# Patient Record
Sex: Female | Born: 1970 | Race: Black or African American | Hispanic: No | Marital: Married | Smoking: Former smoker
Health system: Southern US, Community
[De-identification: ages and names within clinical notes are randomized; demographics above are authoritative.]

## PROBLEM LIST (undated history)

## (undated) DIAGNOSIS — F329 Major depressive disorder, single episode, unspecified: Secondary | ICD-10-CM

## (undated) DIAGNOSIS — F32A Depression, unspecified: Secondary | ICD-10-CM

## (undated) HISTORY — PX: TUBAL LIGATION: SHX77

---

## 2004-10-25 HISTORY — PX: ENDOMETRIAL ABLATION: SHX621

## 2005-09-07 ENCOUNTER — Ambulatory Visit: Payer: Self-pay | Admitting: Internal Medicine

## 2005-11-09 ENCOUNTER — Ambulatory Visit: Payer: Self-pay | Admitting: Obstetrics and Gynecology

## 2005-11-11 ENCOUNTER — Ambulatory Visit: Payer: Self-pay | Admitting: Obstetrics and Gynecology

## 2007-11-28 ENCOUNTER — Ambulatory Visit: Payer: Self-pay | Admitting: Internal Medicine

## 2009-03-19 ENCOUNTER — Ambulatory Visit: Payer: Self-pay | Admitting: Internal Medicine

## 2009-03-28 ENCOUNTER — Ambulatory Visit: Payer: Self-pay | Admitting: Internal Medicine

## 2009-07-18 ENCOUNTER — Other Ambulatory Visit: Payer: Self-pay | Admitting: Internal Medicine

## 2010-06-19 ENCOUNTER — Emergency Department: Payer: Self-pay | Admitting: Emergency Medicine

## 2010-08-12 ENCOUNTER — Ambulatory Visit: Payer: Self-pay | Admitting: Internal Medicine

## 2010-10-16 ENCOUNTER — Ambulatory Visit: Payer: Self-pay | Admitting: General Practice

## 2010-10-25 ENCOUNTER — Ambulatory Visit: Payer: Self-pay | Admitting: General Practice

## 2011-08-02 ENCOUNTER — Other Ambulatory Visit: Payer: Self-pay | Admitting: Internal Medicine

## 2012-01-12 ENCOUNTER — Ambulatory Visit: Payer: Self-pay | Admitting: Internal Medicine

## 2012-04-07 ENCOUNTER — Other Ambulatory Visit: Payer: Self-pay | Admitting: Internal Medicine

## 2012-04-07 LAB — LIPID PANEL
Cholesterol: 107 mg/dL (ref 0–200)
HDL Cholesterol: 26 mg/dL — ABNORMAL LOW (ref 40–60)
Ldl Cholesterol, Calc: 54 mg/dL (ref 0–100)
Triglycerides: 135 mg/dL (ref 0–200)
VLDL Cholesterol, Calc: 27 mg/dL (ref 5–40)

## 2012-04-07 LAB — HEPATIC FUNCTION PANEL A (ARMC)
SGOT(AST): 22 U/L (ref 15–37)
SGPT (ALT): 24 U/L
Total Protein: 7.3 g/dL (ref 6.4–8.2)

## 2012-04-07 LAB — HEMOGLOBIN A1C: Hemoglobin A1C: 7.1 % — ABNORMAL HIGH (ref 4.2–6.3)

## 2012-05-04 ENCOUNTER — Ambulatory Visit: Payer: Self-pay | Admitting: Specialist

## 2012-05-12 ENCOUNTER — Ambulatory Visit: Payer: Self-pay | Admitting: Specialist

## 2012-05-12 LAB — IRON AND TIBC
Iron Bind.Cap.(Total): 384 ug/dL (ref 250–450)
Iron Saturation: 22 %
Iron: 85 ug/dL (ref 50–170)
Unbound Iron-Bind.Cap.: 299 ug/dL

## 2012-05-12 LAB — COMPREHENSIVE METABOLIC PANEL
Alkaline Phosphatase: 69 U/L (ref 50–136)
Bilirubin,Total: 0.3 mg/dL (ref 0.2–1.0)
Calcium, Total: 9.1 mg/dL (ref 8.5–10.1)
Chloride: 101 mmol/L (ref 98–107)
Co2: 31 mmol/L (ref 21–32)
Creatinine: 0.83 mg/dL (ref 0.60–1.30)
EGFR (African American): >60
EGFR (Non-African Amer.): >60
Potassium: 3.7 mmol/L (ref 3.5–5.1)
SGOT(AST): 31 U/L (ref 15–37)
SGPT (ALT): 30 U/L

## 2012-05-12 LAB — CBC WITH DIFFERENTIAL/PLATELET
Basophil %: 1 %
Eosinophil %: 2.9 %
HGB: 12.8 g/dL (ref 12.0–16.0)
Lymphocyte %: 33.9 %
Neutrophil %: 55.7 %
RBC: 4.6 10*6/uL (ref 3.80–5.20)
WBC: 9.7 10*3/uL (ref 3.6–11.0)

## 2012-05-12 LAB — FERRITIN: Ferritin (ARMC): 217 ng/mL (ref 8–388)

## 2012-05-12 LAB — PROTIME-INR
INR: 0.9
Prothrombin Time: 12.5 secs (ref 11.5–14.7)

## 2012-05-12 LAB — AMYLASE: Amylase: 68 U/L (ref 25–115)

## 2012-05-12 LAB — TSH: Thyroid Stimulating Horm: 2.8 u[IU]/mL

## 2012-05-12 LAB — BILIRUBIN, DIRECT: Bilirubin, Direct: <0.1 mg/dL (ref 0.00–0.20)

## 2012-05-12 LAB — LIPASE, BLOOD: Lipase: 98 U/L (ref 73–393)

## 2012-05-12 LAB — MAGNESIUM: Magnesium: 1.9 mg/dL

## 2012-05-25 ENCOUNTER — Ambulatory Visit: Payer: Self-pay | Admitting: Specialist

## 2012-06-16 ENCOUNTER — Ambulatory Visit: Payer: Self-pay | Admitting: Specialist

## 2012-06-25 ENCOUNTER — Ambulatory Visit: Payer: Self-pay | Admitting: Specialist

## 2012-07-25 ENCOUNTER — Ambulatory Visit: Payer: Self-pay | Admitting: Specialist

## 2012-07-27 ENCOUNTER — Ambulatory Visit: Payer: Self-pay | Admitting: Gastroenterology

## 2012-08-25 HISTORY — PX: LAPAROSCOPIC GASTRIC SLEEVE RESECTION: SHX5895

## 2012-09-06 ENCOUNTER — Ambulatory Visit: Payer: Self-pay | Admitting: Specialist

## 2012-09-12 ENCOUNTER — Inpatient Hospital Stay: Payer: Self-pay | Admitting: Specialist

## 2012-09-13 LAB — CBC WITH DIFFERENTIAL/PLATELET
Basophil #: 0 10*3/uL (ref 0.0–0.1)
Eosinophil #: 0 10*3/uL (ref 0.0–0.7)
Lymphocyte #: 1.1 10*3/uL (ref 1.0–3.6)
Lymphocyte %: 8 %
MCH: 28.4 pg (ref 26.0–34.0)
MCHC: 33.9 g/dL (ref 32.0–36.0)
MCV: 84 fL (ref 80–100)
Monocyte %: 4.3 %
Neutrophil #: 12.3 10*3/uL — ABNORMAL HIGH (ref 1.4–6.5)
Neutrophil %: 87.6 %
Platelet: 197 10*3/uL (ref 150–440)
RDW: 15.3 % — ABNORMAL HIGH (ref 11.5–14.5)

## 2012-09-13 LAB — BASIC METABOLIC PANEL
Anion Gap: 8 (ref 7–16)
BUN: 10 mg/dL (ref 7–18)
Calcium, Total: 8.5 mg/dL (ref 8.5–10.1)
Co2: 25 mmol/L (ref 21–32)
Glucose: 122 mg/dL — ABNORMAL HIGH (ref 65–99)
Osmolality: 276 (ref 275–301)
Potassium: 3.6 mmol/L (ref 3.5–5.1)
Sodium: 138 mmol/L (ref 136–145)

## 2012-09-13 LAB — MAGNESIUM: Magnesium: 1.7 mg/dL — ABNORMAL LOW

## 2012-10-02 ENCOUNTER — Ambulatory Visit: Payer: Self-pay | Admitting: Specialist

## 2013-02-02 ENCOUNTER — Other Ambulatory Visit: Payer: Self-pay | Admitting: Specialist

## 2013-02-02 LAB — CBC WITH DIFFERENTIAL/PLATELET
Eosinophil %: 1 %
HCT: 37.6 % (ref 35.0–47.0)
HGB: 12.5 g/dL (ref 12.0–16.0)
MCH: 28.9 pg (ref 26.0–34.0)
MCHC: 33.2 g/dL (ref 32.0–36.0)
Neutrophil #: 5.1 10*3/uL (ref 1.4–6.5)
Platelet: 203 10*3/uL (ref 150–440)
RDW: 15.2 % — ABNORMAL HIGH (ref 11.5–14.5)

## 2013-02-02 LAB — PHOSPHORUS: Phosphorus: 3.7 mg/dL (ref 2.5–4.9)

## 2013-02-02 LAB — COMPREHENSIVE METABOLIC PANEL
Albumin: 3.7 g/dL (ref 3.4–5.0)
Alkaline Phosphatase: 72 U/L (ref 50–136)
Bilirubin,Total: 0.4 mg/dL (ref 0.2–1.0)
Chloride: 100 mmol/L (ref 98–107)
EGFR (African American): >60
Glucose: 91 mg/dL (ref 65–99)
Osmolality: 271 (ref 275–301)
Potassium: 3.1 mmol/L — ABNORMAL LOW (ref 3.5–5.1)
SGOT(AST): 15 U/L (ref 15–37)
SGPT (ALT): 16 U/L (ref 12–78)
Total Protein: 7.6 g/dL (ref 6.4–8.2)

## 2013-02-02 LAB — AMYLASE: Amylase: 59 U/L (ref 25–115)

## 2013-02-02 LAB — FOLATE: Folic Acid: 19 ng/mL (ref 3.1–100.0)

## 2013-02-02 LAB — IRON: Iron: 35 ug/dL — ABNORMAL LOW (ref 50–170)

## 2013-02-02 LAB — MAGNESIUM: Magnesium: 1.8 mg/dL

## 2013-02-02 LAB — FERRITIN: Ferritin (ARMC): 242 ng/mL (ref 8–388)

## 2013-06-06 ENCOUNTER — Ambulatory Visit (INDEPENDENT_AMBULATORY_CARE_PROVIDER_SITE_OTHER): Payer: 59 | Admitting: Family Medicine

## 2013-06-06 ENCOUNTER — Encounter: Payer: Self-pay | Admitting: Family Medicine

## 2013-06-06 VITALS — BP 120/82 | HR 68 | Temp 98.3°F | Ht 67.75 in | Wt 184.0 lb

## 2013-06-06 DIAGNOSIS — J309 Allergic rhinitis, unspecified: Secondary | ICD-10-CM

## 2013-06-06 DIAGNOSIS — Z8679 Personal history of other diseases of the circulatory system: Secondary | ICD-10-CM | POA: Insufficient documentation

## 2013-06-06 DIAGNOSIS — F411 Generalized anxiety disorder: Secondary | ICD-10-CM | POA: Insufficient documentation

## 2013-06-06 DIAGNOSIS — G4700 Insomnia, unspecified: Secondary | ICD-10-CM

## 2013-06-06 DIAGNOSIS — F5104 Psychophysiologic insomnia: Secondary | ICD-10-CM | POA: Insufficient documentation

## 2013-06-06 DIAGNOSIS — N390 Urinary tract infection, site not specified: Secondary | ICD-10-CM | POA: Insufficient documentation

## 2013-06-06 DIAGNOSIS — Z862 Personal history of diseases of the blood and blood-forming organs and certain disorders involving the immune mechanism: Secondary | ICD-10-CM

## 2013-06-06 DIAGNOSIS — Z8639 Personal history of other endocrine, nutritional and metabolic disease: Secondary | ICD-10-CM | POA: Insufficient documentation

## 2013-06-06 DIAGNOSIS — G4726 Circadian rhythm sleep disorder, shift work type: Secondary | ICD-10-CM

## 2013-06-06 DIAGNOSIS — I10 Essential (primary) hypertension: Secondary | ICD-10-CM | POA: Insufficient documentation

## 2013-06-06 LAB — POCT URINALYSIS DIPSTICK
Bilirubin, UA: NEGATIVE
Ketones, UA: NEGATIVE
pH, UA: 6

## 2013-06-06 MED ORDER — CIPROFLOXACIN HCL 250 MG PO TABS: 250.0000 mg | ORAL_TABLET | Freq: Two times a day (BID) | ORAL | Status: DC

## 2013-06-06 NOTE — Patient Instructions (Addendum)
Schedule CPX in next few weeks with fasting labs prior.  Increase water , fiber  And exercise to help with constipation.  Can use miralax as need for constipation. Complete cipro for UTI. Push fluids. We will call with culture results.

## 2013-06-06 NOTE — Addendum Note (Signed)
Addended by: Eliezer Bottom on: 06/06/2013 10:46 AM   Modules accepted: Orders

## 2013-06-06 NOTE — Progress Notes (Signed)
Subjective:    Patient ID: Laura Gordon, female    DOB: Jun 11, 1971, 42 y.o.   MRN: [id number]  HPI  42 year old female presents to establish.  She is Forensic psychologist for insurance reasons.  Previous MD was Dr. Welton Flakes. Last CPX  04/2012...she is due now for a physical. Nml paps in a row. Last mammogram 04/2012.   She has lost 80 lbs since November since she had gastric sleeve surgery. Dr. Smitty Cords.Marland Kitchen Has yearly follow up there in 2 months.  She previously had DM, HTN, high cholesterol. She is no longer on meds for these.  No longer has reflux after weight loss.  No current exercise. She is eating small  low fat low carb meals.  Takes multivitamins and biotin, vit D.   Anxiety, well controlled on 50 mg of sertraline, uses alprazolam 0.25 mg as needed not daily. Uses about 15 tabs in two months.  Uses ambien for sleep... She works night shifts... On other days she uses this.  ONgoing since 2002.  She may have a UTI.Marland Kitchen She has noted 3 days of urinary pressure, no dysuria or frequency.  No blood in urine.   Has had constipation since weight loss surgery... occ use colace or suppositories.   Review of Systems  Constitutional: Negative for fever and fatigue.  HENT: Negative for ear pain.   Eyes: Negative for pain.  Respiratory: Negative for chest tightness and shortness of breath.   Cardiovascular: Negative for chest pain, palpitations and leg swelling.  Gastrointestinal: Positive for constipation. Negative for abdominal pain.  Genitourinary: Negative for dysuria, urgency and frequency.       Objective:   Physical Exam  Constitutional: Vital signs are normal. She appears well-developed and well-nourished. She is cooperative.  Non-toxic appearance. She does not appear ill. No distress.  HENT:  Head: Normocephalic.  Right Ear: Hearing, tympanic membrane, external ear and ear canal normal. Tympanic membrane is not erythematous, not retracted and not bulging.  Left Ear: Hearing, tympanic  membrane, external ear and ear canal normal. Tympanic membrane is not erythematous, not retracted and not bulging.  Nose: No mucosal edema or rhinorrhea. Right sinus exhibits no maxillary sinus tenderness and no frontal sinus tenderness. Left sinus exhibits no maxillary sinus tenderness and no frontal sinus tenderness.  Mouth/Throat: Uvula is midline, oropharynx is clear and moist and mucous membranes are normal.  Eyes: Conjunctivae, EOM and lids are normal. Pupils are equal, round, and reactive to light. Lids are everted and swept, no foreign bodies found.  Neck: Trachea normal and normal range of motion. Neck supple. Carotid bruit is not present. No mass and no thyromegaly present.  Cardiovascular: Normal rate, regular rhythm, S1 normal, S2 normal, normal heart sounds, intact distal pulses and normal pulses.  Exam reveals no gallop and no friction rub.   No murmur heard. Pulmonary/Chest: Effort normal and breath sounds normal. Not tachypneic. No respiratory distress. She has no decreased breath sounds. She has no wheezes. She has no rhonchi. She has no rales.  Abdominal: Soft. Normal appearance and bowel sounds are normal. There is no tenderness.  Neurological: She is alert.  Skin: Skin is warm, dry and intact. No rash noted.  Psychiatric: Her speech is normal and behavior is normal. Judgment and thought content normal. Her mood appears not anxious. Cognition and memory are normal. She does not exhibit a depressed mood.   Diabetic foot exam: Normal inspection No skin breakdown No calluses  Normal DP pulses Normal sensation to light touch and  monofilament Nails normal         Assessment & Plan:

## 2013-06-06 NOTE — Assessment & Plan Note (Signed)
Current UTI. Treat with cipro.  Send for culture.

## 2013-06-08 LAB — URINE CULTURE: Colony Count: >=100000

## 2013-06-14 ENCOUNTER — Encounter: Payer: Self-pay | Admitting: Family Medicine

## 2013-06-15 ENCOUNTER — Encounter: Payer: Self-pay | Admitting: Family Medicine

## 2013-06-15 MED ORDER — FLUCONAZOLE 150 MG PO TABS: 150.0000 mg | ORAL_TABLET | Freq: Once | ORAL | Status: DC

## 2013-07-03 ENCOUNTER — Other Ambulatory Visit (INDEPENDENT_AMBULATORY_CARE_PROVIDER_SITE_OTHER): Payer: 59

## 2013-07-03 DIAGNOSIS — Z8639 Personal history of other endocrine, nutritional and metabolic disease: Secondary | ICD-10-CM

## 2013-07-03 DIAGNOSIS — Z862 Personal history of diseases of the blood and blood-forming organs and certain disorders involving the immune mechanism: Secondary | ICD-10-CM

## 2013-07-03 DIAGNOSIS — Z8679 Personal history of other diseases of the circulatory system: Secondary | ICD-10-CM

## 2013-07-03 LAB — COMPREHENSIVE METABOLIC PANEL
ALT: 19 U/L (ref 0–35)
AST: 19 U/L (ref 0–37)
Alkaline Phosphatase: 51 U/L (ref 39–117)
BUN: 17 mg/dL (ref 6–23)
Calcium: 9.4 mg/dL (ref 8.4–10.5)
Chloride: 100 mEq/L (ref 96–112)
Creatinine, Ser: 0.7 mg/dL (ref 0.4–1.2)
Total Bilirubin: 0.6 mg/dL (ref 0.3–1.2)

## 2013-07-03 LAB — LIPID PANEL
Cholesterol: 293 mg/dL — ABNORMAL HIGH (ref 0–200)
HDL: 51 mg/dL (ref >39.00)
Total CHOL/HDL Ratio: 6
Triglycerides: 72 mg/dL (ref 0.0–149.0)
VLDL: 14.4 mg/dL (ref 0.0–40.0)

## 2013-07-03 LAB — MICROALBUMIN / CREATININE URINE RATIO: Microalb, Ur: 6.5 mg/dL — ABNORMAL HIGH (ref 0.0–1.9)

## 2013-07-10 ENCOUNTER — Encounter: Payer: Self-pay | Admitting: Family Medicine

## 2013-07-10 ENCOUNTER — Other Ambulatory Visit (HOSPITAL_COMMUNITY)
Admission: RE | Admit: 2013-07-10 | Discharge: 2013-07-10 | Disposition: A | Discharge status: Home / Self Care | Payer: 59 | Source: Ambulatory Visit | Attending: Family Medicine | Admitting: Family Medicine

## 2013-07-10 ENCOUNTER — Ambulatory Visit (INDEPENDENT_AMBULATORY_CARE_PROVIDER_SITE_OTHER): Payer: 59 | Admitting: Family Medicine

## 2013-07-10 VITALS — BP 100/70 | HR 74 | Temp 98.3°F | Ht 68.0 in | Wt 185.8 lb

## 2013-07-10 DIAGNOSIS — Z124 Encounter for screening for malignant neoplasm of cervix: Secondary | ICD-10-CM

## 2013-07-10 DIAGNOSIS — Z1151 Encounter for screening for human papillomavirus (HPV): Secondary | ICD-10-CM | POA: Insufficient documentation

## 2013-07-10 DIAGNOSIS — Z01419 Encounter for gynecological examination (general) (routine) without abnormal findings: Secondary | ICD-10-CM | POA: Insufficient documentation

## 2013-07-10 DIAGNOSIS — E7800 Pure hypercholesterolemia, unspecified: Secondary | ICD-10-CM | POA: Insufficient documentation

## 2013-07-10 DIAGNOSIS — Z0000 Encounter for general adult medical examination without abnormal findings: Secondary | ICD-10-CM

## 2013-07-10 DIAGNOSIS — Z23 Encounter for immunization: Secondary | ICD-10-CM

## 2013-07-10 MED ORDER — ATORVASTATIN CALCIUM 40 MG PO TABS: 40.0000 mg | ORAL_TABLET | Freq: Every day | ORAL | Status: DC

## 2013-07-10 NOTE — Assessment & Plan Note (Signed)
Restart atorvastatin. Recheck in 3 months. Encouraged exercise, weight loss, healthy eating habits.

## 2013-07-10 NOTE — Progress Notes (Signed)
Subjective:    Patient ID: Laura Gordon, female    DOB: [birth year], 42 y.o.   MRN: [id number]  HPI  The patient is here for annual wellness exam and preventative care.    Last CPX 04/2012...she is due now for a physical. Nml paps in a row.  Last mammogram 04/2012.   She has lost 80 lbs since November since she had gastric sleeve surgery. Dr. Smitty Cords.Marland Kitchen Has yearly follow up there. She previously had DM, HTN, high cholesterol. She is no longer on meds for these.  No longer has reflux after weight loss.    Anxiety, well controlled on 50 mg of sertraline, uses alprazolam 0.25 mg as needed not daily. Uses about 15 tabs in two months.  Uses ambien for sleep... She works night shifts... On other days she uses this.  ONgoing since 2002.    Elevated Cholesterol:Unfortunately not well controlled after gastric sleeve. LDL goal at least <130 Using medications without problems: on none Diet compliance: Good Exercise: Minimal Other complaints: Wt Readings from Last 3 Encounters:  07/10/13 185 lb 12 oz (84.256 kg)  06/06/13 184 lb (83.462 kg)  She was on vytorin/lipitor in past.. Tolerated well.   Diabetes:  Hx of DM prior to gastric sleeve Well controlled. Lab Results  Component Value Date   HGBA1C 5.5 07/03/2013  Not checking      Review of Systems  Constitutional: Negative for fever, fatigue and unexpected weight change.  HENT: Negative for ear pain, congestion, sore throat, sneezing, trouble swallowing and sinus pressure.   Eyes: Negative for pain and itching.  Respiratory: Negative for cough, shortness of breath and wheezing.   Cardiovascular: Negative for chest pain, palpitations and leg swelling.  Gastrointestinal: Negative for nausea, abdominal pain, diarrhea, constipation and blood in stool.  Genitourinary: Negative for dysuria, hematuria, vaginal discharge, difficulty urinating and menstrual problem.  Skin: Negative for rash.  Neurological: Negative for syncope, weakness,  light-headedness, numbness and headaches.  Psychiatric/Behavioral: Negative for confusion and dysphoric mood. The patient is not nervous/anxious.        Objective:   Physical Exam  Constitutional: Vital signs are normal. She appears well-developed and well-nourished. She is cooperative.  Non-toxic appearance. She does not appear ill. No distress.  HENT:  Head: Normocephalic.  Right Ear: Hearing, tympanic membrane, external ear and ear canal normal.  Left Ear: Hearing, tympanic membrane, external ear and ear canal normal.  Nose: Nose normal.  Eyes: Conjunctivae, EOM and lids are normal. Pupils are equal, round, and reactive to light. Lids are everted and swept, no foreign bodies found.  Neck: Trachea normal and normal range of motion. Neck supple. Carotid bruit is not present. No mass and no thyromegaly present.  Cardiovascular: Normal rate, regular rhythm, S1 normal, S2 normal, normal heart sounds and intact distal pulses.  Exam reveals no gallop.   No murmur heard. Pulmonary/Chest: Effort normal and breath sounds normal. No respiratory distress. She has no wheezes. She has no rhonchi. She has no rales.  Abdominal: Soft. Normal appearance and bowel sounds are normal. She exhibits no distension, no fluid wave, no abdominal bruit and no mass. There is no hepatosplenomegaly. There is no tenderness. There is no rebound, no guarding and no CVA tenderness. No hernia.  Genitourinary: Vagina normal and uterus normal. No breast swelling, tenderness, discharge or bleeding. Pelvic exam was performed with patient supine. There is no rash, tenderness or lesion on the right labia. There is no rash, tenderness or lesion on the left labia. Uterus is  not enlarged and not tender. Cervix exhibits no motion tenderness, no discharge and no friability. Right adnexum displays no mass, no tenderness and no fullness. Left adnexum displays no mass, no tenderness and no fullness.  Lymphadenopathy:    She has no cervical  adenopathy.    She has no axillary adenopathy.  Neurological: She is alert. She has normal strength. No cranial nerve deficit or sensory deficit.  Skin: Skin is warm, dry and intact. No rash noted.  Psychiatric: Her speech is normal and behavior is normal. Judgment normal. Her mood appears not anxious. Cognition and memory are normal. She does not exhibit a depressed mood.          Assessment & Plan:  The patient's preventative maintenance and recommended screening tests for an annual wellness exam were reviewed in full today. Brought up to date unless services declined.  Counselled on the importance of diet, exercise, and its role in overall health and mortality. The patient's FH and SH was reviewed, including their home life, tobacco status, and drug and alcohol status.   Vaccines: Flu given, uptodate with Tdap  PAP/DVE: Nonsmoker, former 20 pack years! mammo: last nml 2013

## 2013-07-10 NOTE — Patient Instructions (Addendum)
Decrease fat and cholesterol in diet. Look at info packet on cholesterol. Start atorvastatin 40 mg daily. Return for  fasting labs only in 3 months. Schedule mammogram on yours own.

## 2013-07-13 ENCOUNTER — Encounter: Payer: Self-pay | Admitting: *Deleted

## 2013-08-09 ENCOUNTER — Ambulatory Visit (INDEPENDENT_AMBULATORY_CARE_PROVIDER_SITE_OTHER): Payer: 59 | Admitting: Family Medicine

## 2013-08-09 ENCOUNTER — Encounter: Payer: Self-pay | Admitting: Family Medicine

## 2013-08-09 VITALS — BP 110/70 | HR 80 | Temp 98.0°F | Wt 190.5 lb

## 2013-08-09 DIAGNOSIS — R300 Dysuria: Secondary | ICD-10-CM

## 2013-08-09 DIAGNOSIS — N390 Urinary tract infection, site not specified: Secondary | ICD-10-CM

## 2013-08-09 LAB — POCT URINALYSIS DIPSTICK
Glucose, UA: NEGATIVE
Ketones, UA: NEGATIVE
Spec Grav, UA: 1.01
Urobilinogen, UA: NEGATIVE

## 2013-08-09 MED ORDER — SULFAMETHOXAZOLE-TMP DS 800-160 MG PO TABS: 1.0000 | ORAL_TABLET | Freq: Two times a day (BID) | ORAL | Status: DC

## 2013-08-09 MED ORDER — FLUCONAZOLE 150 MG PO TABS: 150.0000 mg | ORAL_TABLET | Freq: Once | ORAL | Status: DC

## 2013-08-09 NOTE — Assessment & Plan Note (Signed)
Septra, use diflucan if needed.  Check ucx.  Nontoxic.

## 2013-08-09 NOTE — Progress Notes (Signed)
Dysuria: yes, pain and pressure, with urination.   duration of symptoms: a few days abdominal pain: no fevers:no back pain:no vomiting:no U/a d/w pt.    Feels like prev UTI.   Meds, vitals, and allergies reviewed.   ROS: See HPI.  Otherwise negative.    GEN: nad, alert and oriented HEENT: mucous membranes moist NECK: supple CV: rrr.  PULM: ctab, no inc wob ABD: soft, +bs, suprapubic area tender EXT: no edema SKIN: no acute rash BACK: no CVA pain

## 2013-08-09 NOTE — Patient Instructions (Signed)
If you take the diflucan, then skip the lipitor for a few days.  Drink plenty of water and start the septra today.  We'll contact you with your lab report.  Take care.

## 2013-08-11 LAB — URINE CULTURE: Colony Count: 90000

## 2013-10-02 ENCOUNTER — Other Ambulatory Visit: Payer: Self-pay | Admitting: Family Medicine

## 2013-10-02 NOTE — Telephone Encounter (Signed)
It looks like Dee already handled this but I don't see any details.  Please let me know what needs to be done here.

## 2013-10-09 ENCOUNTER — Encounter: Payer: Self-pay | Admitting: Family Medicine

## 2013-10-09 ENCOUNTER — Other Ambulatory Visit (INDEPENDENT_AMBULATORY_CARE_PROVIDER_SITE_OTHER): Payer: 59

## 2013-10-09 DIAGNOSIS — Z8639 Personal history of other endocrine, nutritional and metabolic disease: Secondary | ICD-10-CM

## 2013-10-09 DIAGNOSIS — Z862 Personal history of diseases of the blood and blood-forming organs and certain disorders involving the immune mechanism: Secondary | ICD-10-CM

## 2013-10-09 DIAGNOSIS — Z8679 Personal history of other diseases of the circulatory system: Secondary | ICD-10-CM

## 2013-10-09 DIAGNOSIS — E7800 Pure hypercholesterolemia, unspecified: Secondary | ICD-10-CM

## 2013-10-09 LAB — LIPID PANEL
LDL Cholesterol: 77 mg/dL (ref 0–99)
Total CHOL/HDL Ratio: 3

## 2013-10-09 LAB — COMPREHENSIVE METABOLIC PANEL
ALT: 21 U/L (ref 0–35)
AST: 22 U/L (ref 0–37)
Albumin: 4.4 g/dL (ref 3.5–5.2)
Alkaline Phosphatase: 52 U/L (ref 39–117)
BUN: 12 mg/dL (ref 6–23)
Calcium: 9.2 mg/dL (ref 8.4–10.5)
Chloride: 101 mEq/L (ref 96–112)
Potassium: 3.7 mEq/L (ref 3.5–5.1)
Sodium: 136 mEq/L (ref 135–145)
Total Protein: 7.6 g/dL (ref 6.0–8.3)

## 2013-10-09 MED ORDER — SERTRALINE HCL 50 MG PO TABS: 50.0000 mg | ORAL_TABLET | Freq: Every day | ORAL | Status: DC

## 2013-10-12 ENCOUNTER — Encounter: Payer: Self-pay | Admitting: *Deleted

## 2013-11-07 ENCOUNTER — Encounter: Payer: Self-pay | Admitting: Family Medicine

## 2013-11-08 MED ORDER — ALPRAZOLAM 0.25 MG PO TABS: 0.2500 mg | ORAL_TABLET | Freq: Two times a day (BID) | ORAL | Status: DC | PRN

## 2013-11-08 MED ORDER — ZOLPIDEM TARTRATE 10 MG PO TABS: 10.0000 mg | ORAL_TABLET | Freq: Every evening | ORAL | Status: DC | PRN

## 2013-11-08 NOTE — Telephone Encounter (Signed)
Called to [facility]. 

## 2013-11-13 ENCOUNTER — Encounter: Payer: Self-pay | Admitting: Family Medicine

## 2014-01-01 ENCOUNTER — Other Ambulatory Visit: Payer: Self-pay | Admitting: Family Medicine

## 2014-01-01 MED ORDER — ZOLPIDEM TARTRATE 10 MG PO TABS: 10.0000 mg | ORAL_TABLET | Freq: Every evening | ORAL | Status: DC | PRN

## 2014-01-01 MED ORDER — ALPRAZOLAM 0.25 MG PO TABS: 0.2500 mg | ORAL_TABLET | Freq: Two times a day (BID) | ORAL | Status: DC

## 2014-01-01 MED ORDER — ALPRAZOLAM 0.25 MG PO TABS: 0.2500 mg | ORAL_TABLET | Freq: Two times a day (BID) | ORAL | Status: DC | PRN

## 2014-01-01 NOTE — Telephone Encounter (Signed)
Last office visit 08/09/2013 with Dr. Duncan.  Ok to refill? 

## 2014-01-01 NOTE — Addendum Note (Signed)
Addended by: Damita LackLORING, Lamae Fosco S on: 01/01/2014 02:08 PM   Modules accepted: Orders

## 2014-01-01 NOTE — Telephone Encounter (Signed)
Called to [facility]. 

## 2014-01-25 ENCOUNTER — Other Ambulatory Visit: Payer: Self-pay | Admitting: Family Medicine

## 2014-02-17 ENCOUNTER — Other Ambulatory Visit: Payer: Self-pay | Admitting: Family Medicine

## 2014-02-18 MED ORDER — ZOLPIDEM TARTRATE 10 MG PO TABS: 10.0000 mg | ORAL_TABLET | Freq: Every evening | ORAL | Status: DC | PRN

## 2014-02-18 MED ORDER — ALPRAZOLAM 0.25 MG PO TABS: 0.2500 mg | ORAL_TABLET | Freq: Two times a day (BID) | ORAL | Status: DC

## 2014-02-18 NOTE — Telephone Encounter (Signed)
Last office visit 08/09/2013 with Dr. Para Marchuncan.  Ok to refill?

## 2014-02-19 NOTE — Telephone Encounter (Signed)
Called to [facility].

## 2014-03-28 ENCOUNTER — Other Ambulatory Visit: Payer: Self-pay | Admitting: Family Medicine

## 2014-03-28 MED ORDER — ZOLPIDEM TARTRATE 10 MG PO TABS: 10.0000 mg | ORAL_TABLET | Freq: Every evening | ORAL | Status: DC | PRN

## 2014-03-28 NOTE — Telephone Encounter (Signed)
Last office visit 08/09/2013 with Dr. Para March.  Last refilled 02/18/2014 for #30.  Ok to refill?

## 2014-03-28 NOTE — Telephone Encounter (Signed)
Called to [facility].

## 2014-04-29 ENCOUNTER — Other Ambulatory Visit: Payer: Self-pay | Admitting: Family Medicine

## 2014-04-30 MED ORDER — ZOLPIDEM TARTRATE 10 MG PO TABS: 10.0000 mg | ORAL_TABLET | Freq: Every evening | ORAL | Status: DC | PRN

## 2014-04-30 MED ORDER — SERTRALINE HCL 50 MG PO TABS: ORAL_TABLET | ORAL | Status: DC

## 2014-04-30 MED ORDER — ALPRAZOLAM 0.25 MG PO TABS: 0.2500 mg | ORAL_TABLET | Freq: Two times a day (BID) | ORAL | Status: DC

## 2014-04-30 NOTE — Telephone Encounter (Signed)
Alprazolam & Ambien called to [facility].

## 2014-05-03 ENCOUNTER — Encounter: Payer: Self-pay | Admitting: Family Medicine

## 2014-05-03 ENCOUNTER — Ambulatory Visit (INDEPENDENT_AMBULATORY_CARE_PROVIDER_SITE_OTHER): Payer: 59 | Admitting: Family Medicine

## 2014-05-03 VITALS — BP 104/76 | HR 71 | Temp 98.1°F | Ht 68.0 in | Wt 208.2 lb

## 2014-05-03 DIAGNOSIS — N765 Ulceration of vagina: Secondary | ICD-10-CM | POA: Insufficient documentation

## 2014-05-03 DIAGNOSIS — R300 Dysuria: Secondary | ICD-10-CM | POA: Insufficient documentation

## 2014-05-03 DIAGNOSIS — N72 Inflammatory disease of cervix uteri: Secondary | ICD-10-CM

## 2014-05-03 DIAGNOSIS — N390 Urinary tract infection, site not specified: Secondary | ICD-10-CM

## 2014-05-03 DIAGNOSIS — N7689 Other specified inflammation of vagina and vulva: Secondary | ICD-10-CM

## 2014-05-03 LAB — POCT URINALYSIS DIPSTICK
Bilirubin, UA: NEGATIVE
Glucose, UA: NEGATIVE
KETONES UA: NEGATIVE
Leukocytes, UA: NEGATIVE
Nitrite, UA: NEGATIVE
PROTEIN UA: NEGATIVE
SPEC GRAV UA: 1.015
Urobilinogen, UA: 0.2
pH, UA: 6

## 2014-05-03 NOTE — Progress Notes (Signed)
Pre visit review using our clinic review tool, if applicable. No additional management support is needed unless otherwise documented below in the visit note. 

## 2014-05-03 NOTE — Patient Instructions (Addendum)
Push fluids avoid bladder irritants. Call if symptoms not improving in 2 weeks. We will call with viral culture. Apply OTC topical 2.5 mg hydrocortisone cream twice daily x 2 week. Call if lesion not improving.

## 2014-05-03 NOTE — Progress Notes (Signed)
   Subjective:    Patient ID: Fredric MareCassandra P Willingham, female    DOB: 1971-10-10, 43 y.o.   MRN: [id number]  Dysuria  Associated symptoms include frequency.  Urinary Frequency  Associated symptoms include frequency.    43 year old female with history of frequent UTI  Presents with new onset  dysuria and urinary frequency in last 1 week. She has noted urgency, no blood in urine. Mild low abd central pain. No flank pain, no fever.  Last UTI  07/2013 Ecoli  pansensitive. Treated with septra.  She has also noted a sore area on labia on right. Noted 1 week ago.  She has history of pimples in groin. Not changing in size. Tried heat, neosporin, epifoam ( hydrocortisone).  Sexually active but with husband, no concerns about exposure. No known exposure to herpes. No vaginal discharge.  No family with oral herpes either.      Review of Systems  Constitutional: Negative for fatigue.  HENT: Negative for ear pain.   Eyes: Negative for pain.  Cardiovascular: Negative for chest pain.  Gastrointestinal: Negative for abdominal pain.  Genitourinary: Positive for dysuria and frequency.       Objective:   Physical Exam  Constitutional: Vital signs are normal. She appears well-developed and well-nourished. She is cooperative.  Non-toxic appearance. She does not appear ill. No distress.  HENT:  Head: Normocephalic.  Right Ear: Hearing, tympanic membrane, external ear and ear canal normal. Tympanic membrane is not erythematous, not retracted and not bulging.  Left Ear: Hearing, tympanic membrane, external ear and ear canal normal. Tympanic membrane is not erythematous, not retracted and not bulging.  Nose: No mucosal edema or rhinorrhea. Right sinus exhibits no maxillary sinus tenderness and no frontal sinus tenderness. Left sinus exhibits no maxillary sinus tenderness and no frontal sinus tenderness.  Mouth/Throat: Uvula is midline, oropharynx is clear and moist and mucous membranes are normal.    Eyes: Conjunctivae, EOM and lids are normal. Pupils are equal, round, and reactive to light. Lids are everted and swept, no foreign bodies found.  Neck: Trachea normal and normal range of motion. Neck supple. Carotid bruit is not present. No mass and no thyromegaly present.  Cardiovascular: Normal rate, regular rhythm, S1 normal, S2 normal, normal heart sounds, intact distal pulses and normal pulses.  Exam reveals no gallop and no friction rub.   No murmur heard. Pulmonary/Chest: Effort normal and breath sounds normal. Not tachypneic. No respiratory distress. She has no decreased breath sounds. She has no wheezes. She has no rhonchi. She has no rales.  Abdominal: Soft. Normal appearance and bowel sounds are normal. There is no tenderness. There is no CVA tenderness.  Genitourinary:    There is lesion on the left labia.  1.5 cm white red rimmed ulcer  Neurological: She is alert.  Skin: Skin is warm, dry and intact. No rash noted.  Psychiatric: Her speech is normal and behavior is normal. Judgment and thought content normal. Her mood appears not anxious. Cognition and memory are normal. She does not exhibit a depressed mood.          Assessment & Plan:

## 2014-05-03 NOTE — Assessment & Plan Note (Signed)
Send for viral culture.  Apply steroid cream x 2 week . ? abrasion vs vaginal cancer/autoimmune lesion.  if not improving refer to GYN for possible biopsy.

## 2014-05-03 NOTE — Assessment & Plan Note (Signed)
No clear sign of infection. ? Bladder irritation. Has been eating a lot of citris. Push fluids and avoid bladder irritants.

## 2014-05-03 NOTE — Addendum Note (Signed)
Addended by: Damita LackLORING, DONNA S on: 05/03/2014 04:59 PM   Modules accepted: Orders

## 2014-05-06 LAB — HERPES SIMPLEX VIRUS CULTURE: ORGANISM ID, BACTERIA: NOT DETECTED

## 2014-06-19 ENCOUNTER — Other Ambulatory Visit: Payer: Self-pay | Admitting: Family Medicine

## 2014-06-20 MED ORDER — ALPRAZOLAM 0.25 MG PO TABS: 0.2500 mg | ORAL_TABLET | Freq: Two times a day (BID) | ORAL | Status: DC

## 2014-06-20 MED ORDER — ZOLPIDEM TARTRATE 10 MG PO TABS: 10.0000 mg | ORAL_TABLET | Freq: Every evening | ORAL | Status: DC | PRN

## 2014-06-20 NOTE — Telephone Encounter (Signed)
Called to [facility]. 

## 2014-07-04 ENCOUNTER — Encounter: Payer: Self-pay | Admitting: Family Medicine

## 2014-07-04 ENCOUNTER — Ambulatory Visit (INDEPENDENT_AMBULATORY_CARE_PROVIDER_SITE_OTHER): Payer: 59 | Admitting: Family Medicine

## 2014-07-04 VITALS — BP 110/80 | HR 92 | Temp 98.2°F | Ht 68.0 in | Wt 213.0 lb

## 2014-07-04 DIAGNOSIS — J302 Other seasonal allergic rhinitis: Secondary | ICD-10-CM

## 2014-07-04 DIAGNOSIS — J069 Acute upper respiratory infection, unspecified: Secondary | ICD-10-CM

## 2014-07-04 DIAGNOSIS — J3089 Other allergic rhinitis: Secondary | ICD-10-CM

## 2014-07-04 DIAGNOSIS — B9789 Other viral agents as the cause of diseases classified elsewhere: Secondary | ICD-10-CM

## 2014-07-04 MED ORDER — DESLORATADINE-PSEUDOEPHED ER 2.5-120 MG PO TB12: 1.0000 | ORAL_TABLET | Freq: Two times a day (BID) | ORAL | Status: DC

## 2014-07-04 MED ORDER — FLUTICASONE PROPIONATE 50 MCG/ACT NA SUSP: 2.0000 | Freq: Every day | NASAL | Status: DC

## 2014-07-04 NOTE — Progress Notes (Signed)
Subjective:    Patient ID: Laura Gordon, female    DOB: 1970/11/27, 43 y.o.   MRN: [id number]  Cough This is a new problem. The current episode started in the past 7 days. The problem has been gradually worsening. The problem occurs constantly. The cough is productive of sputum. Associated symptoms include ear pain, headaches, nasal congestion and rhinorrhea. Pertinent negatives include no fever, myalgias, rash, sore throat, shortness of breath, weight loss or wheezing. Nothing aggravates the symptoms. Risk factors: nonsmoker. Treatments tried: sudafed 12 hour, afrin. The treatment provided no relief. Her past medical history is significant for environmental allergies. There is no history of asthma, bronchiectasis, bronchitis, COPD, emphysema or pneumonia.  Headache  Associated symptoms include coughing, ear pain and rhinorrhea. Pertinent negatives include no fever, hearing loss, sore throat or weight loss.  Otalgia  There is pain in both (right > left) ears. This is a new problem. The current episode started in the past 7 days. The problem has been gradually worsening. Associated symptoms include coughing, headaches and rhinorrhea. Pertinent negatives include no ear discharge, hearing loss, rash or sore throat. She has tried nothing for the symptoms. There is no history of a chronic ear infection, hearing loss or a tympanostomy tube.   In past for allergies she has failed clatritin , zyrtec and allegra > 30 days. Clarinex D has helped a lot in the past.    Review of Systems  Constitutional: Negative for fever and weight loss.  HENT: Positive for ear pain and rhinorrhea. Negative for ear discharge, hearing loss and sore throat.   Respiratory: Positive for cough. Negative for shortness of breath and wheezing.   Musculoskeletal: Negative for myalgias.  Skin: Negative for rash.  Allergic/Immunologic: Positive for environmental allergies.  Neurological: Positive for headaches.         Objective:   Physical Exam  Constitutional: Vital signs are normal. She appears well-developed and well-nourished. She is cooperative.  Non-toxic appearance. She does not appear ill. No distress.  HENT:  Head: Normocephalic.  Right Ear: Hearing, tympanic membrane, external ear and ear canal normal. Tympanic membrane is not erythematous, not retracted and not bulging.  Left Ear: Hearing, tympanic membrane, external ear and ear canal normal. Tympanic membrane is not erythematous, not retracted and not bulging.  Nose: No mucosal edema or rhinorrhea. Right sinus exhibits no maxillary sinus tenderness and no frontal sinus tenderness. Left sinus exhibits no maxillary sinus tenderness and no frontal sinus tenderness.  Mouth/Throat: Uvula is midline, oropharynx is clear and moist and mucous membranes are normal.  Eyes: Conjunctivae, EOM and lids are normal. Pupils are equal, round, and reactive to light. Lids are everted and swept, no foreign bodies found.  Neck: Trachea normal and normal range of motion. Neck supple. Carotid bruit is not present. No mass and no thyromegaly present.  Cardiovascular: Normal rate, regular rhythm, S1 normal, S2 normal, normal heart sounds, intact distal pulses and normal pulses.  Exam reveals no gallop and no friction rub.   No murmur heard. Pulmonary/Chest: Effort normal and breath sounds normal. Not tachypneic. No respiratory distress. She has no decreased breath sounds. She has no wheezes. She has no rhonchi. She has no rales.  Abdominal: Soft. Normal appearance and bowel sounds are normal. There is no tenderness.  Neurological: She is alert.  Skin: Skin is warm, dry and intact. No rash noted.  Psychiatric: Her speech is normal and behavior is normal. Judgment and thought content normal. Her mood appears not anxious. Cognition  and memory are normal. She does not exhibit a depressed mood.          Assessment & Plan:

## 2014-07-04 NOTE — Progress Notes (Signed)
Pre visit review using our clinic review tool, if applicable. No additional management support is needed unless otherwise documented below in the visit note. 

## 2014-07-04 NOTE — Assessment & Plan Note (Signed)
Stop afrin. Change to nasal steroid. Symptomatic care, call if cough suppressant needed for sleep. Mucinex DM.

## 2014-07-04 NOTE — Patient Instructions (Signed)
Start fluticasone spray 2 sprays per nostril daily. Stop afrin, or only use rarely. Consider nasal saline irrigation/ spray 2-3 times a day. Mucinex DM twice daily. Fluids, and rest.

## 2014-07-04 NOTE — Assessment & Plan Note (Signed)
Restart clarinex D prn.

## 2014-08-15 ENCOUNTER — Encounter: Payer: Self-pay | Admitting: Family Medicine

## 2014-08-15 ENCOUNTER — Other Ambulatory Visit: Payer: Self-pay | Admitting: Family Medicine

## 2014-08-15 MED ORDER — ZOLPIDEM TARTRATE 10 MG PO TABS: 10.0000 mg | ORAL_TABLET | Freq: Every evening | ORAL | Status: DC | PRN

## 2014-08-15 MED ORDER — ALPRAZOLAM 0.25 MG PO TABS: 0.2500 mg | ORAL_TABLET | Freq: Two times a day (BID) | ORAL | Status: DC

## 2014-08-15 MED ORDER — FLUTICASONE PROPIONATE 50 MCG/ACT NA SUSP: 2.0000 | Freq: Every day | NASAL | Status: DC

## 2014-08-15 NOTE — Telephone Encounter (Signed)
[facility] & Ambien [facility] to [facility].

## 2014-08-15 NOTE — Telephone Encounter (Signed)
Last office visit 07/04/2014.  Alprazolam & Ambien last refilled 06/20/2014.  Ok to refill?

## 2014-08-20 ENCOUNTER — Other Ambulatory Visit: Payer: Self-pay | Admitting: Family Medicine

## 2014-10-03 ENCOUNTER — Other Ambulatory Visit: Payer: Self-pay | Admitting: Family Medicine

## 2014-10-04 MED ORDER — ZOLPIDEM TARTRATE 10 MG PO TABS: 10.0000 mg | ORAL_TABLET | Freq: Every evening | ORAL | Status: DC | PRN

## 2014-10-04 NOTE — Telephone Encounter (Signed)
Called to [facility].

## 2014-10-04 NOTE — Telephone Encounter (Signed)
Last office visit 07/04/2014.  Last refilled 08/15/2014 for #30 with no refills.  Ok to refill?

## 2014-11-06 ENCOUNTER — Other Ambulatory Visit: Payer: Self-pay | Admitting: Family Medicine

## 2014-11-07 MED ORDER — ALPRAZOLAM 0.25 MG PO TABS: 0.2500 mg | ORAL_TABLET | Freq: Two times a day (BID) | ORAL | Status: DC

## 2014-11-07 NOTE — Telephone Encounter (Signed)
Called to [facility]. 

## 2014-11-07 NOTE — Addendum Note (Signed)
Addended by: Damita LackLORING, DONNA S on: 11/07/2014 12:44 PM   Modules accepted: Medications

## 2014-11-19 ENCOUNTER — Other Ambulatory Visit: Payer: Self-pay | Admitting: Family Medicine

## 2014-11-19 MED ORDER — ZOLPIDEM TARTRATE 10 MG PO TABS: 10.0000 mg | ORAL_TABLET | Freq: Every evening | ORAL | Status: DC | PRN

## 2014-11-19 NOTE — Telephone Encounter (Signed)
Called to [facility]. 

## 2014-11-19 NOTE — Telephone Encounter (Signed)
Last office visit 07/04/2014 for URI.  Last refilled 10/04/2014 for #30 with no refills.  Ok to refill?

## 2014-12-03 ENCOUNTER — Encounter: Payer: Self-pay | Admitting: Family Medicine

## 2014-12-03 MED ORDER — SERTRALINE HCL 50 MG PO TABS
ORAL_TABLET | ORAL | Status: DC
Start: 2014-12-03 — End: 2015-06-17

## 2015-01-07 ENCOUNTER — Other Ambulatory Visit: Payer: Self-pay | Admitting: Family Medicine

## 2015-01-07 MED ORDER — ZOLPIDEM TARTRATE 10 MG PO TABS: 10.0000 mg | ORAL_TABLET | Freq: Every evening | ORAL | Status: DC | PRN

## 2015-01-07 MED ORDER — ALPRAZOLAM 0.25 MG PO TABS: 0.2500 mg | ORAL_TABLET | Freq: Two times a day (BID) | ORAL | Status: DC

## 2015-01-07 NOTE — Telephone Encounter (Signed)
Last office visit 07/04/2014.  Last refilled alprazolam 11/07/2014 for #60 with no refills/ambien 11/19/2014 for #30 with no refills.  Ok to refill?

## 2015-01-08 NOTE — Telephone Encounter (Signed)
Called to [facility]. 

## 2015-01-22 ENCOUNTER — Telehealth: Payer: Self-pay | Admitting: Family Medicine

## 2015-01-22 NOTE — Telephone Encounter (Signed)
Last office visit 07/04/2014 for URI.  Last Lipid Panel 10/09/2013.  No future appointments.  Ok to refill?

## 2015-01-23 NOTE — Telephone Encounter (Signed)
Left message asking pt to call office  °

## 2015-01-23 NOTE — Telephone Encounter (Signed)
Pt left v/m requesting cb [phone].

## 2015-01-23 NOTE — Telephone Encounter (Signed)
Needs appt for CPX with labs prior. Refill until then.

## 2015-01-23 NOTE — Telephone Encounter (Signed)
Please call and schedule CPE with fasting labs prior with Dr. Ermalene SearingBedsole.  Please route back to me once appointment is scheduled so I can refill her medication to get her to her appointment.

## 2015-01-23 NOTE — Telephone Encounter (Signed)
Lab 6/6 cpe 6/10 Pt aware  Please close

## 2015-02-11 NOTE — Op Note (Signed)
PATIENT NAME:  Laura Gordon, Dennice P MR#:  [id number] DATE OF BIRTH:  [birth year]  DATE OF PROCEDURE:  09/12/2012  PREOPERATIVE DIAGNOSES:  1. Morbid obesity. 2. Diabetes. 3. Hypertension. 4. Obstructive sleep apnea.  POSTOPERATIVE DIAGNOSES: 1. Morbid obesity. 2. Diabetes. 3. Hypertension. 4. Obstructive sleep apnea.  PROCEDURE:  Laparoscopic sleeve gastrectomy.  SURGEON: Primus BravoJon Cherlyn Syring, MD  ASSISTANT:  Mariella SaaSarah Stout, PA  ANESTHESIA:  General endotracheal.  INDICATION:  See History and Physical.   COMPLICATIONS: None.  ESTIMATED BLOOD LOSS: None.  FINDINGS: No significant hiatal hernia.  CLINICAL HISTORY: See History and Physical.   DETAILS OF PROCEDURE:  The patient was taken to the operating room and placed on the operating room table, in the supine position, with appropriate monitors and supplemental oxygen being delivered.  Broad spectrum IV antibiotics were administered. The patient was placed under general anesthesia without incident.  The abdomen was prepped and draped in the usual sterile fashion.  Access was obtained using 5 mm Optical trocar. Pneumoperitoneum was established without difficulty. Multiple other ports were placed in preparation for sleeve gastrectomy. A liver retractor was placed without incident. The entire stomach was mobilized from 5 cm from the pylorus all the way up to the fundus, and the fundus was mobilized off the left crura as well completely freeing up the posterior portion of the stomach. Posterior attachments were taken down so the crura could be visualized from both sides.  At that point, everything was removed from the stomach and a 2534 [place] Bougie was placed down into the antrum. An Echelon green load stapler was used to bisect the antrum on first fire starting approximately 5 to 6 cm from the pylorus. I then continued up along the Bougie using a blue load stapler with excellent affect with care not to get too close to the Bougie itself, with minimal  traction. This continued all the way up to the left crura. The excess stomach was placed on the side and the Bougie was removed and endoscopy showed no evidence of obstruction at that time.  The excess stomach was removed through the abdominal cavity, and the wounds were closed using 4-0 Vicryl and Dermabond.   ADDENDUM: Seam guard was used with all staple fires. ____________________________ Primus BravoJon Adrain Nesbit, MD jb:slb D: 09/12/2012 14:15:57 ET T: 09/12/2012 14:25:16 ET JOB#: [id number]  cc: Primus BravoJon Kiarah Eckstein, MD, <Dictator> Lyndon CodeFozia M. Khan, MD Geoffry ParadiseJON M Larcenia Holaday MD ELECTRONICALLY SIGNED 09/18/2012 15:57

## 2015-02-21 ENCOUNTER — Other Ambulatory Visit: Payer: Self-pay | Admitting: Family Medicine

## 2015-02-22 NOTE — Telephone Encounter (Signed)
Last office visit 07/04/2014. CPE scheduled 04/04/2015.  Last refilled 01/07/2015 for #30 with no refills.  Ok to refill?

## 2015-02-23 MED ORDER — ZOLPIDEM TARTRATE 10 MG PO TABS: 10.0000 mg | ORAL_TABLET | Freq: Every evening | ORAL | Status: DC | PRN

## 2015-02-24 NOTE — Telephone Encounter (Signed)
Called to [facility]. 

## 2015-03-16 ENCOUNTER — Other Ambulatory Visit: Payer: Self-pay | Admitting: Family Medicine

## 2015-03-17 MED ORDER — ALPRAZOLAM 0.25 MG PO TABS: 0.2500 mg | ORAL_TABLET | Freq: Two times a day (BID) | ORAL | Status: DC

## 2015-03-17 NOTE — Telephone Encounter (Signed)
[facility]

## 2015-03-17 NOTE — Telephone Encounter (Signed)
Last office visit 07/04/2014.  CPE scheduled 04/04/2015.  Last refilled 01/07/2015 for #60 with no refills.  Ok to refill?

## 2015-03-27 ENCOUNTER — Other Ambulatory Visit: Payer: Self-pay | Admitting: Family Medicine

## 2015-03-27 NOTE — Telephone Encounter (Signed)
Last office visit 07/04/2014.  CPE scheduled 04/24/2015. Last refilled 02/23/2015 for #30 with no refills.  Ok to refill?

## 2015-03-28 ENCOUNTER — Telehealth: Payer: Self-pay | Admitting: Family Medicine

## 2015-03-28 DIAGNOSIS — Z8639 Personal history of other endocrine, nutritional and metabolic disease: Secondary | ICD-10-CM

## 2015-03-28 DIAGNOSIS — E7800 Pure hypercholesterolemia, unspecified: Secondary | ICD-10-CM

## 2015-03-28 MED ORDER — ZOLPIDEM TARTRATE 10 MG PO TABS: 10.0000 mg | ORAL_TABLET | Freq: Every evening | ORAL | Status: DC | PRN

## 2015-03-28 NOTE — Telephone Encounter (Signed)
Called to [facility].

## 2015-03-28 NOTE — Telephone Encounter (Signed)
-----   Message from Alvina Chouerri J Walsh sent at 03/26/2015  4:19 PM EDT ----- Regarding: Lab orders for Monday, 6.6.16 Patient is scheduled for CPX labs, please order future labs, Thanks , Camelia Engerri

## 2015-03-31 ENCOUNTER — Other Ambulatory Visit: Payer: 59

## 2015-04-04 ENCOUNTER — Encounter: Payer: 59 | Admitting: Family Medicine

## 2015-04-24 ENCOUNTER — Encounter: Payer: Self-pay | Admitting: Family Medicine

## 2015-04-24 ENCOUNTER — Ambulatory Visit (INDEPENDENT_AMBULATORY_CARE_PROVIDER_SITE_OTHER): Payer: PRIVATE HEALTH INSURANCE | Admitting: Family Medicine

## 2015-04-24 ENCOUNTER — Encounter: Payer: Self-pay | Admitting: Radiology

## 2015-04-24 VITALS — BP 122/90 | HR 69 | Temp 98.0°F | Ht 68.75 in | Wt 231.8 lb

## 2015-04-24 DIAGNOSIS — Z8639 Personal history of other endocrine, nutritional and metabolic disease: Secondary | ICD-10-CM

## 2015-04-24 DIAGNOSIS — E7800 Pure hypercholesterolemia, unspecified: Secondary | ICD-10-CM

## 2015-04-24 DIAGNOSIS — Z8679 Personal history of other diseases of the circulatory system: Secondary | ICD-10-CM

## 2015-04-24 DIAGNOSIS — F411 Generalized anxiety disorder: Secondary | ICD-10-CM

## 2015-04-24 DIAGNOSIS — Z0000 Encounter for general adult medical examination without abnormal findings: Secondary | ICD-10-CM | POA: Diagnosis not present

## 2015-04-24 LAB — LIPID PANEL
CHOL/HDL RATIO: 3
Cholesterol: 134 mg/dL (ref 0–200)
HDL: 44.8 mg/dL (ref >39.00)
LDL Cholesterol: 76 mg/dL (ref 0–99)
NonHDL: 89.2
TRIGLYCERIDES: 67 mg/dL (ref 0.0–149.0)
VLDL: 13.4 mg/dL (ref 0.0–40.0)

## 2015-04-24 LAB — COMPREHENSIVE METABOLIC PANEL
ALT: 16 U/L (ref 0–35)
AST: 19 U/L (ref 0–37)
Albumin: 3.9 g/dL (ref 3.5–5.2)
Alkaline Phosphatase: 65 U/L (ref 39–117)
BUN: 12 mg/dL (ref 6–23)
CALCIUM: 9.3 mg/dL (ref 8.4–10.5)
CHLORIDE: 102 meq/L (ref 96–112)
CO2: 32 meq/L (ref 19–32)
CREATININE: 0.69 mg/dL (ref 0.40–1.20)
GFR: 118.04 mL/min (ref >60.00)
GLUCOSE: 86 mg/dL (ref 70–99)
Potassium: 3.6 mEq/L (ref 3.5–5.1)
Sodium: 140 mEq/L (ref 135–145)
TOTAL PROTEIN: 7.4 g/dL (ref 6.0–8.3)
Total Bilirubin: 0.4 mg/dL (ref 0.2–1.2)

## 2015-04-24 LAB — HEMOGLOBIN A1C: Hgb A1c MFr Bld: 5.6 % (ref 4.6–6.5)

## 2015-04-24 NOTE — Progress Notes (Signed)
Pre visit review using our clinic review tool, if applicable. No additional management support is needed unless otherwise documented below in the visit note. 

## 2015-04-24 NOTE — Patient Instructions (Addendum)
Stop at lab on way out.  Work on [employer] eating and regular exercise.  Set up mammogram on your own.

## 2015-04-24 NOTE — Progress Notes (Signed)
The patient is here for annual wellness exam and preventative care.   She has lost  weight since gastric sleeve surgery, but has gained 20 lbs back.  Dr. Smitty CordsBruce.[facility] Kitchen. Has yearly follow up there. She previously had DM, HTN, high cholesterol. She is no longer on meds for these.  No longer has reflux after weight loss Wt Readings from Last 3 Encounters:  04/24/15 231 lb 12.8 oz (105.144 kg)  07/04/14 213 lb (96.616 kg)  05/03/14 208 lb 4 oz (94.462 kg)  Exercise: none  Diet: poor Increase in stress with new job.   . Anxiety, well controlled on 50 mg of sertraline, uses alprazolam 0.25 mg as needed not daily, using three times week.  Uses ambien for sleep. Insomnia improved some now working days.  Due for re-eval of cholesterol, DM.   Review of Systems  Constitutional: Negative for fever, fatigue and unexpected weight change.  HENT: Negative for ear pain, congestion, sore throat, sneezing, trouble swallowing and sinus pressure.  Eyes: Negative for pain and itching.  Respiratory: Negative for cough, shortness of breath and wheezing.  Cardiovascular: Negative for chest pain, palpitations and leg swelling.  Gastrointestinal: Negative for nausea, abdominal pain, diarrhea, constipation and blood in stool.  Genitourinary: Negative for dysuria, hematuria, vaginal discharge, difficulty urinating and menstrual problem.  Skin: Negative for rash.  Neurological: Negative for syncope, weakness, light-headedness, numbness and headaches.  Psychiatric/Behavioral: Negative for confusion and dysphoric mood. The patient is not nervous/anxious.       Objective:   Physical Exam  Constitutional: Vital signs are normal. She appears well-developed and well-nourished. She is cooperative. Non-toxic appearance. She does not appear ill. No distress.  HENT:  Head: Normocephalic.  Right Ear: Hearing, tympanic membrane, external ear and ear canal normal.  Left Ear: Hearing, tympanic membrane, external  ear and ear canal normal.  Nose: Nose normal.  Eyes: Conjunctivae, EOM and lids are normal. Pupils are equal, round, and reactive to light. Lids are everted and swept, no foreign bodies found.  Neck: Trachea normal and normal range of motion. Neck supple. Carotid bruit is not present. No mass and no thyromegaly present.  Cardiovascular: Normal rate, regular rhythm, S1 normal, S2 normal, normal heart sounds and intact distal pulses. Exam reveals no gallop.  No murmur heard. Pulmonary/Chest: Effort normal and breath sounds normal. No respiratory distress. She has no wheezes. She has no rhonchi. She has no rales.  Abdominal: Soft. Normal appearance and bowel sounds are normal. She exhibits no distension, no fluid wave, no abdominal bruit and no mass. There is no hepatosplenomegaly. There is no tenderness. There is no rebound, no guarding and no CVA tenderness. No hernia.  Genitourinary: Vagina normal and uterus normal. No breast swelling, tenderness, discharge or bleeding. Pelvic exam was performed with patient supine. There is no rash, tenderness or lesion on the right labia. There is no rash, tenderness or lesion on the left labia. Uterus is not enlarged and not tender. Cervix exhibits no motion tenderness, no discharge and no friability. Right adnexum displays no mass, no tenderness and no fullness. Left adnexum displays no mass, no tenderness and no fullness.  Lymphadenopathy:   She has no cervical adenopathy.   She has no axillary adenopathy.  Neurological: She is alert. She has normal strength. No cranial nerve deficit or sensory deficit.  Skin: Skin is warm, dry and intact. No rash noted.  Psychiatric: Her speech is normal and behavior is normal. Judgment normal. Her mood appears not anxious. Cognition and memory are  normal. She does not exhibit a depressed mood.          Assessment & Plan:  The patient's preventative maintenance and recommended screening tests for an annual  wellness exam were reviewed in full today. Brought up to date unless services declined.  Counselled on the importance of diet, exercise, and its role in overall health and mortality. The patient's FH and SH was reviewed, including their home life, tobacco status, and drug and alcohol status.   Vaccines: Uptodate with Tdap PAP/DVE: 06/2013 on q3 years, due for yearly DVE. Nonsmoker, former 20 pack years! mammo: last nml 2 year ago.

## 2015-04-25 ENCOUNTER — Encounter: Payer: Self-pay | Admitting: *Deleted

## 2015-04-25 NOTE — Assessment & Plan Note (Signed)
Due for yearly eval, may have worsened with poor diet and regain of weight in last year. Encouraged exercise, weight loss, healthy eating habits.  

## 2015-04-25 NOTE — Assessment & Plan Note (Signed)
Due for yearly eval, may have worsened with poor diet and regain of weight in last year. Encouraged exercise, weight loss, healthy eating habits.

## 2015-04-25 NOTE — Assessment & Plan Note (Signed)
well controlled on 50 mg of sertraline, uses alprazolam 0.25 mg as needed not daily, using three times week.  Uses ambien for sleep. Insomnia improved some now working days.

## 2015-04-25 NOTE — Assessment & Plan Note (Signed)
Well controlled on no med. 

## 2015-04-29 ENCOUNTER — Other Ambulatory Visit: Payer: Self-pay | Admitting: Family Medicine

## 2015-04-29 ENCOUNTER — Encounter: Payer: Self-pay | Admitting: Family Medicine

## 2015-04-29 MED ORDER — ATORVASTATIN CALCIUM 40 MG PO TABS: 40.0000 mg | ORAL_TABLET | Freq: Every day | ORAL | Status: DC

## 2015-04-29 NOTE — Telephone Encounter (Signed)
Last office visit 04/24/2015.  Last refilled 03/28/2015 for #30 with no refills.  Ok to refill?

## 2015-04-30 ENCOUNTER — Encounter: Payer: Self-pay | Admitting: Family Medicine

## 2015-04-30 MED ORDER — FLUTICASONE PROPIONATE 50 MCG/ACT NA SUSP: 2.0000 | Freq: Every day | NASAL | Status: DC

## 2015-04-30 NOTE — Addendum Note (Signed)
Addended by: Damita LackLORING, DONNA S on: 04/30/2015 06:32 PM   Modules accepted: Orders

## 2015-05-01 MED ORDER — ZOLPIDEM TARTRATE 10 MG PO TABS: 10.0000 mg | ORAL_TABLET | Freq: Every evening | ORAL | Status: DC | PRN

## 2015-05-01 NOTE — Telephone Encounter (Signed)
Rx called into [facility] as directed.

## 2015-05-16 ENCOUNTER — Encounter: Payer: Self-pay | Admitting: Family Medicine

## 2015-05-19 ENCOUNTER — Other Ambulatory Visit: Payer: Self-pay | Admitting: Family Medicine

## 2015-05-20 MED ORDER — ALPRAZOLAM 0.25 MG PO TABS: 0.2500 mg | ORAL_TABLET | Freq: Two times a day (BID) | ORAL | Status: DC

## 2015-05-20 NOTE — Telephone Encounter (Signed)
Called to [facility] [address] 

## 2015-05-20 NOTE — Telephone Encounter (Signed)
Last office visit 04/24/2015.  Last refilled 03/20/2015 for #60 with no refills.  Ok to refill?

## 2015-06-01 ENCOUNTER — Other Ambulatory Visit: Payer: Self-pay | Admitting: Family Medicine

## 2015-06-02 ENCOUNTER — Other Ambulatory Visit: Payer: Self-pay | Admitting: *Deleted

## 2015-06-02 NOTE — Telephone Encounter (Signed)
Ok to refill 

## 2015-06-03 ENCOUNTER — Other Ambulatory Visit: Payer: Self-pay | Admitting: Family Medicine

## 2015-06-03 MED ORDER — ZOLPIDEM TARTRATE 10 MG PO TABS: 10.0000 mg | ORAL_TABLET | Freq: Every evening | ORAL | Status: DC | PRN

## 2015-06-03 NOTE — Telephone Encounter (Signed)
Rx called in as directed.   

## 2015-06-03 NOTE — Telephone Encounter (Signed)
Ok to refill 

## 2015-06-16 ENCOUNTER — Encounter: Payer: Self-pay | Admitting: Family Medicine

## 2015-06-17 MED ORDER — SERTRALINE HCL 50 MG PO TABS: ORAL_TABLET | ORAL | Status: DC

## 2015-06-22 ENCOUNTER — Emergency Department: Payer: PRIVATE HEALTH INSURANCE

## 2015-06-22 ENCOUNTER — Observation Stay: Payer: PRIVATE HEALTH INSURANCE

## 2015-06-22 ENCOUNTER — Encounter: Payer: Self-pay | Admitting: Emergency Medicine

## 2015-06-22 ENCOUNTER — Observation Stay
Admit: 2015-06-22 | Discharge: 2015-06-22 | Disposition: A | Discharge status: Home / Self Care | Payer: PRIVATE HEALTH INSURANCE | Attending: Internal Medicine | Admitting: Internal Medicine

## 2015-06-22 ENCOUNTER — Observation Stay
Admission: EM | Admit: 2015-06-22 | Discharge: 2015-06-22 | Disposition: A | Discharge status: Home / Self Care | Payer: PRIVATE HEALTH INSURANCE | Attending: Internal Medicine | Admitting: Internal Medicine

## 2015-06-22 ENCOUNTER — Other Ambulatory Visit: Payer: Self-pay

## 2015-06-22 DIAGNOSIS — H532 Diplopia: Secondary | ICD-10-CM | POA: Insufficient documentation

## 2015-06-22 DIAGNOSIS — R4182 Altered mental status, unspecified: Secondary | ICD-10-CM | POA: Diagnosis not present

## 2015-06-22 DIAGNOSIS — N390 Urinary tract infection, site not specified: Secondary | ICD-10-CM | POA: Diagnosis not present

## 2015-06-22 DIAGNOSIS — R300 Dysuria: Secondary | ICD-10-CM | POA: Insufficient documentation

## 2015-06-22 DIAGNOSIS — E785 Hyperlipidemia, unspecified: Secondary | ICD-10-CM | POA: Diagnosis not present

## 2015-06-22 DIAGNOSIS — G4700 Insomnia, unspecified: Secondary | ICD-10-CM | POA: Diagnosis not present

## 2015-06-22 DIAGNOSIS — Z79899 Other long term (current) drug therapy: Secondary | ICD-10-CM | POA: Diagnosis not present

## 2015-06-22 DIAGNOSIS — G451 Carotid artery syndrome (hemispheric): Secondary | ICD-10-CM | POA: Diagnosis not present

## 2015-06-22 DIAGNOSIS — G459 Transient cerebral ischemic attack, unspecified: Secondary | ICD-10-CM | POA: Insufficient documentation

## 2015-06-22 DIAGNOSIS — J309 Allergic rhinitis, unspecified: Secondary | ICD-10-CM | POA: Diagnosis not present

## 2015-06-22 DIAGNOSIS — I340 Nonrheumatic mitral (valve) insufficiency: Secondary | ICD-10-CM | POA: Diagnosis not present

## 2015-06-22 DIAGNOSIS — F329 Major depressive disorder, single episode, unspecified: Secondary | ICD-10-CM | POA: Insufficient documentation

## 2015-06-22 DIAGNOSIS — I371 Nonrheumatic pulmonary valve insufficiency: Secondary | ICD-10-CM | POA: Insufficient documentation

## 2015-06-22 DIAGNOSIS — Z87440 Personal history of urinary (tract) infections: Secondary | ICD-10-CM | POA: Diagnosis not present

## 2015-06-22 DIAGNOSIS — R4701 Aphasia: Secondary | ICD-10-CM | POA: Diagnosis not present

## 2015-06-22 DIAGNOSIS — N765 Ulceration of vagina: Secondary | ICD-10-CM | POA: Insufficient documentation

## 2015-06-22 DIAGNOSIS — I10 Essential (primary) hypertension: Secondary | ICD-10-CM | POA: Diagnosis not present

## 2015-06-22 DIAGNOSIS — J069 Acute upper respiratory infection, unspecified: Secondary | ICD-10-CM | POA: Insufficient documentation

## 2015-06-22 DIAGNOSIS — Z841 Family history of disorders of kidney and ureter: Secondary | ICD-10-CM | POA: Insufficient documentation

## 2015-06-22 DIAGNOSIS — I6523 Occlusion and stenosis of bilateral carotid arteries: Secondary | ICD-10-CM | POA: Insufficient documentation

## 2015-06-22 DIAGNOSIS — E119 Type 2 diabetes mellitus without complications: Secondary | ICD-10-CM | POA: Diagnosis not present

## 2015-06-22 DIAGNOSIS — F411 Generalized anxiety disorder: Secondary | ICD-10-CM | POA: Insufficient documentation

## 2015-06-22 DIAGNOSIS — G479 Sleep disorder, unspecified: Secondary | ICD-10-CM | POA: Insufficient documentation

## 2015-06-22 DIAGNOSIS — E780 Pure hypercholesterolemia: Secondary | ICD-10-CM | POA: Insufficient documentation

## 2015-06-22 DIAGNOSIS — Z87891 Personal history of nicotine dependence: Secondary | ICD-10-CM | POA: Diagnosis not present

## 2015-06-22 DIAGNOSIS — Z8673 Personal history of transient ischemic attack (TIA), and cerebral infarction without residual deficits: Secondary | ICD-10-CM | POA: Diagnosis present

## 2015-06-22 DIAGNOSIS — R4781 Slurred speech: Secondary | ICD-10-CM | POA: Diagnosis not present

## 2015-06-22 DIAGNOSIS — I071 Rheumatic tricuspid insufficiency: Secondary | ICD-10-CM | POA: Insufficient documentation

## 2015-06-22 DIAGNOSIS — Z8249 Family history of ischemic heart disease and other diseases of the circulatory system: Secondary | ICD-10-CM | POA: Insufficient documentation

## 2015-06-22 DIAGNOSIS — Z833 Family history of diabetes mellitus: Secondary | ICD-10-CM | POA: Diagnosis not present

## 2015-06-22 HISTORY — DX: Major depressive disorder, single episode, unspecified: F32.9

## 2015-06-22 HISTORY — DX: Depression, unspecified: F32.A

## 2015-06-22 LAB — URINE DRUG SCREEN, QUALITATIVE (ARMC ONLY)
Amphetamines, Ur Screen: NOT DETECTED — AB
BARBITURATES, UR SCREEN: NOT DETECTED — AB
Benzodiazepine, Ur Scrn: POSITIVE — AB
CANNABINOID 50 NG, UR ~~LOC~~: NOT DETECTED — AB
COCAINE METABOLITE, UR ~~LOC~~: NOT DETECTED — AB
MDMA (Ecstasy)Ur Screen: NOT DETECTED — AB
Methadone Scn, Ur: NOT DETECTED — AB
OPIATE, UR SCREEN: NOT DETECTED — AB
PHENCYCLIDINE (PCP) UR S: NOT DETECTED — AB
TRICYCLIC, UR SCREEN: NOT DETECTED — AB

## 2015-06-22 LAB — URINALYSIS COMPLETE WITH MICROSCOPIC (ARMC ONLY)
Bilirubin Urine: NEGATIVE
GLUCOSE, UA: NEGATIVE mg/dL
KETONES UR: NEGATIVE mg/dL
NITRITE: NEGATIVE
Protein, ur: 100 mg/dL — AB
SPECIFIC GRAVITY, URINE: 1.023 (ref 1.005–1.030)
pH: 5 (ref 5.0–8.0)

## 2015-06-22 LAB — COMPREHENSIVE METABOLIC PANEL
ALK PHOS: 72 U/L (ref 38–126)
ALT: 16 U/L (ref 14–54)
ANION GAP: 9 (ref 5–15)
AST: 22 U/L (ref 15–41)
Albumin: 4.1 g/dL (ref 3.5–5.0)
BILIRUBIN TOTAL: 0.4 mg/dL (ref 0.3–1.2)
BUN: 18 mg/dL (ref 6–20)
CALCIUM: 9.8 mg/dL (ref 8.9–10.3)
CO2: 30 mmol/L (ref 22–32)
Chloride: 103 mmol/L (ref 101–111)
Creatinine, Ser: 0.81 mg/dL (ref 0.44–1.00)
Glucose, Bld: 96 mg/dL (ref 65–99)
Potassium: 3.6 mmol/L (ref 3.5–5.1)
Sodium: 142 mmol/L (ref 135–145)
TOTAL PROTEIN: 7.8 g/dL (ref 6.5–8.1)

## 2015-06-22 LAB — CBC WITH DIFFERENTIAL/PLATELET
BASOS ABS: 0.1 10*3/uL (ref 0–0.1)
BASOS PCT: 1 %
Eosinophils Absolute: 0.1 10*3/uL (ref 0–0.7)
Eosinophils Relative: 1 %
HEMATOCRIT: 40.7 % (ref 35.0–47.0)
HEMOGLOBIN: 13.2 g/dL (ref 12.0–16.0)
Lymphocytes Relative: 30 %
Lymphs Abs: 2.6 10*3/uL (ref 1.0–3.6)
MCH: 27.7 pg (ref 26.0–34.0)
MCHC: 32.4 g/dL (ref 32.0–36.0)
MCV: 85.5 fL (ref 80.0–100.0)
Monocytes Absolute: 0.7 10*3/uL (ref 0.2–0.9)
Monocytes Relative: 8 %
NEUTROS ABS: 5.2 10*3/uL (ref 1.4–6.5)
NEUTROS PCT: 60 %
Platelets: 201 10*3/uL (ref 150–440)
RBC: 4.76 MIL/uL (ref 3.80–5.20)
RDW: 14.2 % (ref 11.5–14.5)
WBC: 8.7 10*3/uL (ref 3.6–11.0)

## 2015-06-22 LAB — CREATININE, SERUM
CREATININE: 0.81 mg/dL (ref 0.44–1.00)
GFR calc Af Amer: >60 mL/min (ref >60)
GFR calc non Af Amer: >60 mL/min (ref >60)

## 2015-06-22 LAB — TROPONIN I: Troponin I: <0.03 ng/mL (ref <0.031)

## 2015-06-22 MED ORDER — ENOXAPARIN SODIUM 40 MG/0.4ML ~~LOC~~ SOLN: 40.0000 mg | SUBCUTANEOUS | Status: DC

## 2015-06-22 MED ORDER — ACETAMINOPHEN 325 MG PO TABS: 650.0000 mg | ORAL_TABLET | Freq: Four times a day (QID) | ORAL | Status: DC | PRN

## 2015-06-22 MED ORDER — ASPIRIN 81 MG PO TBEC: 81.0000 mg | DELAYED_RELEASE_TABLET | Freq: Every day | ORAL | Status: AC

## 2015-06-22 MED ORDER — SODIUM CHLORIDE 0.9 % IJ SOLN
3.0000 mL | Freq: Two times a day (BID) | INTRAMUSCULAR | Status: DC
  Administered 2015-06-22: 12:00:00 3 mL via INTRAVENOUS

## 2015-06-22 MED ORDER — ASPIRIN 81 MG PO CHEW: 324.0000 mg | CHEWABLE_TABLET | Freq: Once | ORAL | Status: AC

## 2015-06-22 MED ORDER — DIPHENHYDRAMINE HCL (SLEEP) 25 MG PO TABS: 25.0000 mg | ORAL_TABLET | Freq: Every evening | ORAL | Status: DC | PRN

## 2015-06-22 MED ORDER — SERTRALINE HCL 50 MG PO TABS
50.0000 mg | ORAL_TABLET | Freq: Every day | ORAL | Status: DC
  Filled 2015-06-22: qty 1

## 2015-06-22 MED ORDER — ZOLPIDEM TARTRATE 5 MG PO TABS: 10.0000 mg | ORAL_TABLET | Freq: Every evening | ORAL | Status: DC | PRN

## 2015-06-22 MED ORDER — SODIUM CHLORIDE 0.9 % IJ SOLN: 3.0000 mL | Freq: Two times a day (BID) | INTRAMUSCULAR | Status: DC

## 2015-06-22 MED ORDER — DIPHENHYDRAMINE HCL 25 MG PO CAPS: 25.0000 mg | ORAL_CAPSULE | Freq: Every evening | ORAL | Status: DC | PRN

## 2015-06-22 MED ORDER — ADULT MULTIVITAMIN W/MINERALS CH
1.0000 | ORAL_TABLET | Freq: Every day | ORAL | Status: DC
  Filled 2015-06-22: qty 1

## 2015-06-22 MED ORDER — ASPIRIN EC 81 MG PO TBEC
81.0000 mg | DELAYED_RELEASE_TABLET | Freq: Every day | ORAL | Status: DC
  Filled 2015-06-22: qty 1

## 2015-06-22 MED ORDER — SODIUM CHLORIDE 0.9 % IV SOLN: 250.0000 mL | INTRAVENOUS | Status: DC | PRN

## 2015-06-22 MED ORDER — FLUTICASONE PROPIONATE 50 MCG/ACT NA SUSP
2.0000 | Freq: Every day | NASAL | Status: DC
  Administered 2015-06-22: 12:00:00 2 via NASAL
  Filled 2015-06-22: qty 16

## 2015-06-22 MED ORDER — ACETAMINOPHEN 650 MG RE SUPP: 650.0000 mg | Freq: Four times a day (QID) | RECTAL | Status: DC | PRN

## 2015-06-22 MED ORDER — ALPRAZOLAM 0.25 MG PO TABS
0.2500 mg | ORAL_TABLET | Freq: Two times a day (BID) | ORAL | Status: DC
  Filled 2015-06-22: qty 1

## 2015-06-22 MED ORDER — ONDANSETRON HCL 4 MG PO TABS: 4.0000 mg | ORAL_TABLET | Freq: Four times a day (QID) | ORAL | Status: DC | PRN

## 2015-06-22 MED ORDER — ONDANSETRON HCL 4 MG/2ML IJ SOLN: 4.0000 mg | Freq: Four times a day (QID) | INTRAMUSCULAR | Status: DC | PRN

## 2015-06-22 MED ORDER — MELATONIN 5 MG PO TABS
1.0000 | ORAL_TABLET | Freq: Every day | ORAL | Status: DC | PRN
Start: 2015-06-22 — End: 2015-06-22

## 2015-06-22 MED ORDER — SODIUM CHLORIDE 0.9 % IJ SOLN: 3.0000 mL | INTRAMUSCULAR | Status: DC | PRN

## 2015-06-22 MED ORDER — ATORVASTATIN CALCIUM 20 MG PO TABS
40.0000 mg | ORAL_TABLET | Freq: Every day | ORAL | Status: DC
  Filled 2015-06-22: qty 2

## 2015-06-22 MED ORDER — ASPIRIN 81 MG PO CHEW
CHEWABLE_TABLET | ORAL | Status: AC
  Administered 2015-06-22: 324 mg
  Filled 2015-06-22: qty 4

## 2015-06-22 NOTE — Plan of Care (Signed)
Problem: Discharge/Transitional Outcomes Goal: Other Discharge Outcomes/Goals Outcome: Progressing Plan of care progress to goals: Barriers to progression- None Educational plan-stroke education given in addition to Lovenox education; continue to educate patient in relation to results and diagnosis. Hemodynamically stable- VSS, telemetry is NSR HR-61. Independent mobility- moderate fall risk, steady gait, ambulates independently. Tolerating diet- no c/o nausea or vomiting since arrival to unit. Family and patient agree on d/c plan- pt and family agree pt to go home at time of d/c.  Family willing and able- pt is independent and able to care for self, husband lives with wife and is able to assist patient if needed. PCP appointment- new admission, update when available.  Ability to obtain medications- pt states that she has no problems obtaining her medications.   A&O patient admitted with TIA. NIH on arrival was "0". No c/o pain or discomfort. Neruo assessment- WNL. Lovenox education provided and pt is currently refusing any blood thinner, I did advise patient that I would relay this message to the Day RN, due to medication is dosed on days. Skin assessment, WNL. Off unit telemetry SR, hr 61, tele box MX 40-42.

## 2015-06-22 NOTE — ED Notes (Signed)
Patient transported to X-ray 

## 2015-06-22 NOTE — H&P (Signed)
[facility] [place] at [facility]   PATIENT NAME: Laura Gordon    MR#:  [id number]  DATE OF BIRTH:  02/09/1971  DATE OF ADMISSION:  06/22/2015  PRIMARY CARE PHYSICIAN: Kerby Nora, MD   REQUESTING/REFERRING PHYSICIAN: Dorothea Glassman  CHIEF COMPLAINT:   Chief Complaint  Patient presents with  . Aphasia  . Altered Mental Status    HISTORY OF PRESENT ILLNESS:  Laura Gordon  is a 44 y.o. female with a known history of hyperlipidemia, GAD/depression, insomnia presents to the emergency room with the complaints of sudden onset of slurred speech with associated double vision lasting for 5 minutes and resolved completely. Patient states that last night while she was at home, she was noticed by her daughter having slurred speech and double vision. No associated focal weakness or numbness, swallowing difficulties, bladder or bowel disturbances, facial asymmetry. EMS was called on site who recommended coming to the ED. Patient was brought to the ED by her husband. Evaluation  in the ED on arrival revealed normal neuro examination and normal vital signs and workup was essentially negative with normal CBC, CMP. CT of the head noncontrast study negative for any acute intracranial pathology. Chest x-ray negative for acute cardio pulmonary pathology. EKG normal sinus rhythm with ventricular rate of 66 bpm. Hospitalist service was consulted for further evaluation and management. Patient at the current time is comfortably resting in the bed and denies any complaints such as focal weakness or numbness, speech or swallow difficulties, visual disturbances. No history of any similar symptoms in the past.. Patient denies any  recent fever or chills, chest pain, shortness of breath, nausea, vomiting, diarrhea, abdominal pain, dysuria, frequency, urgency.  PAST MEDICAL HISTORY:   Past Medical History  Diagnosis Date  . Depression    hyperlipidemia  PAST SURGICAL HISTORY:   Past  Surgical History  Procedure Laterality Date  . Cesarean section    . Tubal ligation    . 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 Endometrial ablation  10/25/2004  . Cesarean section N/A     SOCIAL HISTORY:   Social History  Substance Use Topics  . Smoking status: Former Smoker -- 1.00 packs/day for 20 years    Types: Cigarettes  . Smokeless tobacco: Never Used  . Alcohol Use: No    FAMILY HISTORY:   Family History  Problem Relation Age of Onset  . Heart disease Mother 83  . Diabetes Mother   . Hypertension Mother   . Hyperlipidemia Mother   . Kidney disease Father 39  . Heart disease Father   . Diabetes Daughter 74    type 2   . Diabetes Son 12    type 1  . Diabetes Maternal Grandmother   . Heart disease Paternal Grandfather   . Hyperlipidemia Paternal Grandfather   . Hypertension Paternal Grandfather     DRUG ALLERGIES:  No Known Allergies  REVIEW OF SYSTEMS:   Review of Systems  Constitutional: Negative for fever, chills and malaise/fatigue.  HENT: Negative for ear pain, hearing loss, nosebleeds, sore throat and tinnitus.   Eyes: Negative for blurred vision, double vision, pain, discharge and redness.  Respiratory: Negative for cough, hemoptysis, sputum production, shortness of breath and wheezing.   Cardiovascular: Negative for chest pain, palpitations, orthopnea and leg swelling.  Gastrointestinal: Negative for nausea, vomiting, abdominal pain, diarrhea, constipation, blood in stool and melena.  Genitourinary: Negative for dysuria, urgency, frequency and hematuria.  Musculoskeletal: Negative for back pain, joint pain and  neck pain.  Skin: Negative for itching and rash.  Neurological: Negative for dizziness, tingling, sensory change, focal weakness and seizures.       Transient episode of speech disturbances with double vision as noted in history of present illness.  Endo/Heme/Allergies: Does not bruise/bleed easily.  Psychiatric/Behavioral:  Positive for depression. The patient is not nervous/anxious.     MEDICATIONS AT HOME:   Prior to Admission medications   Medication Sig Start Date End Date Taking? Authorizing Provider  ALPRAZolam (XANAX) 0.25 MG tablet Take 1 tablet (0.25 mg total) by mouth 2 (two) times daily. Take one by mouth twice a day as needed for anxiety 05/20/15  Yes Amy E Bedsole, MD  atorvastatin (LIPITOR) 40 MG tablet Take 1 tablet (40 mg total) by mouth daily. 04/29/15  Yes Amy Michelle Nasuti, MD  diphenhydrAMINE (SOMINEX) 25 MG tablet Take 25 mg by mouth at bedtime as needed for sleep.   Yes Historical Provider, MD  fluticasone (FLONASE) 50 MCG/ACT nasal spray Place 2 sprays into both nostrils daily. 04/30/15  Yes Amy Michelle Nasuti, MD  Melatonin 5 MG TABS Take 1-2 tablets by mouth daily as needed.   Yes Historical Provider, MD  Multiple Vitamin (MULTIVITAMIN) tablet Take 1 tablet by mouth daily.    Yes Historical Provider, MD  sertraline (ZOLOFT) 50 MG tablet TAKE 1 TABLET (50 MG TOTAL) BY MOUTH DAILY. 06/17/15  Yes Amy Michelle Nasuti, MD  zolpidem (AMBIEN) 10 MG tablet Take 1 tablet (10 mg total) by mouth at bedtime as needed for sleep. 06/03/15  Yes Amy Michelle Nasuti, MD  desloratadine-pseudoephedrine (CLARINEX-D 12-HOUR) 2.5-120 MG per tablet Take 1 tablet by mouth 2 (two) times daily. Patient not taking: Reported on 04/24/2015 07/04/14   Excell Seltzer, MD      VITAL SIGNS:  Blood pressure 139/94, pulse 63, temperature 97.9 F (36.6 C), temperature source Oral, resp. rate 18, height 5\' 8"  (1.727 m), weight 99.791 kg (220 lb), last menstrual period 09/30/2014, SpO2 98 %.  PHYSICAL EXAMINATION:  Physical Exam  Constitutional: She is oriented to person, place, and time. She appears well-developed and well-nourished. No distress.  HENT:  Head: Normocephalic and atraumatic.  Right Ear: External ear normal.  Left Ear: External ear normal.  Nose: Nose normal.  Mouth/Throat: Oropharynx is clear and moist. No oropharyngeal exudate.   Eyes: EOM are normal. Pupils are equal, round, and reactive to light. No scleral icterus.  Neck: Normal range of motion. Neck supple. No JVD present. No thyromegaly present.  Cardiovascular: Normal rate, regular rhythm, normal heart sounds and intact distal pulses.  Exam reveals no friction rub.   No murmur heard. Respiratory: Effort normal and breath sounds normal. No respiratory distress. She has no wheezes. She has no rales. She exhibits no tenderness.  GI: Soft. Bowel sounds are normal. She exhibits no distension and no mass. There is no tenderness. There is no rebound and no guarding.  Musculoskeletal: Normal range of motion. She exhibits no edema.  Lymphadenopathy:    She has no cervical adenopathy.  Neurological: She is alert and oriented to person, place, and time. She has normal reflexes. She displays normal reflexes. No cranial nerve deficit. She exhibits normal muscle tone.  Skin: Skin is warm. No rash noted. No erythema.  Psychiatric: She has a normal mood and affect. Her behavior is normal. Thought content normal.   LABORATORY PANEL:   CBC  Recent Labs Lab 06/22/15 0112  WBC 8.7  HGB 13.2  HCT 40.7  PLT 201   ------------------------------------------------------------------------------------------------------------------  Chemistries   Recent Labs Lab 06/22/15 0112  NA 142  K 3.6  CL 103  CO2 30  GLUCOSE 96  BUN 18  CREATININE 0.81  CALCIUM 9.8  AST 22  ALT 16  ALKPHOS 72  BILITOT 0.4   ------------------------------------------------------------------------------------------------------------------  Cardiac Enzymes  Recent Labs Lab 06/22/15 0112  TROPONINI <0.03   ------------------------------------------------------------------------------------------------------------------  RADIOLOGY:  Dg Chest 2 View  06/22/2015   CLINICAL DATA:  EKG reads ST-elevation MI.  EXAM: CHEST  2 VIEW  COMPARISON:  Report from chest radiographs 05/12/2012,  images not available.  FINDINGS: The heart is at the upper limits of normal in size, this was reported previously. The lungs are clear. Pulmonary vasculature is normal. No consolidation, pleural effusion, or pneumothorax. No acute osseous abnormalities are seen.  IMPRESSION: No acute pulmonary process.   Electronically Signed   By: Rubye Oaks M.D.   On: 06/22/2015 01:50   Ct Head Wo Contrast  06/22/2015   CLINICAL DATA:  Altered mental status. Slurred speech. Blurry vision.  EXAM: CT HEAD WITHOUT CONTRAST  TECHNIQUE: Contiguous axial images were obtained from the base of the skull through the vertex without intravenous contrast.  COMPARISON:  06/21/2010  FINDINGS: No intracranial hemorrhage, mass effect, or midline shift. No hydrocephalus. The basilar cisterns are patent. No evidence of territorial infarct. No intracranial fluid collection. Calvarium is intact. Included paranasal sinuses and mastoid air cells are well aerated.  IMPRESSION: No acute intracranial abnormality.   Electronically Signed   By: Rubye Oaks M.D.   On: 06/22/2015 01:49    EKG:   Orders placed or performed during the hospital encounter of 06/22/15  . ED EKG  . ED EKG  Normal sinus rhythm with ventricular rate of 66 bpm. Nonspecific ST-T abnormalities. No new changes compared to EKG of July 2013  IMPRESSION AND PLAN:   44 year old female with history of hyperlipidemia presents with the complaints of transient slurred speech with double vision, lasting for 5 minutes and resolved spontaneously. 1. Transient episode of slurred speech with double vision, lasting for 5 minutes, resolved spontaneously. Normal neuro examination. Negative CT head-TIA. Plan: Admit, telemetry monitoring, neuro watch, aspirin. Request MRI brain, echocardiogram, carotid Doppler for further evaluation. Neurology consultation requested for further advice. 2. Hyperlipidemia, stable on statin. Continue same. 3. Abnormal urinalysis. No UTI  symptoms.? Contaminated sample. Check urine culture, follow-up accordingly. 4. Depression/GAD, stable on home medications. Continue same.    All the records are reviewed and case discussed with ED provider. Management plans discussed with the patient, family and they are in agreement.  CODE STATUS: Full code  TOTAL TIME TAKING CARE OF THIS PATIENT: 45 minutes.    Jonnie Kind N M.D on 06/22/2015 at 3:18 AM  Between 7am to 6pm - Pager - [phone]  After 6pm go to [email or web address] - password [email or web address] [facility]  [facility]  Office  [phone]  CC: Primary care physician; Kerby Nora, MD

## 2015-06-22 NOTE — ED Notes (Signed)
Husband says pt was at home tonight when she began having slurred speech; pt reported double vision; husband says when he stood her up from laying on the bed pt was having balance issues; pt reports no headache; paramedics called to house and advised pt to come to ED

## 2015-06-22 NOTE — ED Provider Notes (Signed)
[facility] Emergency Department Provider Note  ____________________________________________  Time seen: Approximately 2:25 AM  I have reviewed the triage vital signs and the nursing notes.   HISTORY  Chief Complaint Aphasia and Altered Mental Status    HPI Laura Gordon is a 44 y.o. female patient and family reports she was in the room talking to her daughter when she became a little confused slow to respond seemed to be seeing double. Family reported this because he said she was trying to touch her daughter but Touching off to the Side . She also had had a droopy face and slurred speech. This resolved by the time she got to the back in the ER. Lasted probably less than half an hour. She has never had this before patient denied a headache and has not had any chest pain shortness of breath or any other complaints.   Past Medical History  Diagnosis Date  . Depression     Patient Active Problem List   Diagnosis Date Noted  . TIA (transient ischemic attack) 06/22/2015  . Viral URI with cough 07/04/2014  . Dysuria 05/03/2014  . Vaginal ulcer 05/03/2014  . High cholesterol 07/10/2013  . Allergic rhinitis 06/06/2013  . Frequent UTI 06/06/2013  . Hx of diabetes mellitus 06/06/2013  . Hx of essential hypertension 06/06/2013  . Generalized anxiety disorder 06/06/2013  . Insomnia 06/06/2013  . 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 Cesarean section    . Tubal ligation    . 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 Endometrial ablation  10/25/2004  . Cesarean section N/A     No current outpatient prescriptions on file.  Allergies Review of patient's allergies indicates no known allergies.  Family History  Problem Relation Age of Onset  . Heart disease Mother 66  . Diabetes Mother   . Hypertension Mother   . Hyperlipidemia Mother   . Kidney disease Father 1  . Heart disease Father    . Diabetes Daughter 71    type 2   . Diabetes Son 12    type 1  . Diabetes Maternal Grandmother   . Heart disease Paternal Grandfather   . Hyperlipidemia Paternal Grandfather   . Hypertension Paternal Grandfather     Social History Social History  Substance Use Topics  . Smoking status: Former Smoker -- 1.00 packs/day for 20 years    Types: Cigarettes  . Smokeless tobacco: Never Used  . Alcohol Use: No    Review of Systems Constitutional: No fever/chills Eyes see history of present illness ENT: No sore throat. Cardiovascular: Denies chest pain. Respiratory: Denies shortness of breath. Gastrointestinal: No abdominal pain.  No nausea, no vomiting.  No diarrhea.  No constipation. Genitourinary: Negative for dysuria. Musculoskeletal: Negative for back pain. Skin: Negative for rash. Neurological: Negative for headaches  10-point ROS otherwise negative.  ____________________________________________   PHYSICAL EXAM:  VITAL SIGNS: ED Triage Vitals  Enc Vitals Group     BP 06/22/15 0048 141/93 mmHg     Pulse Rate 06/22/15 0048 78     Resp 06/22/15 0048 18     Temp 06/22/15 0048 98.4 F (36.9 C)     Temp Source 06/22/15 0048 Oral     SpO2 06/22/15 0048 98 %     Weight 06/22/15 0048 220 lb (99.791 kg)     Height 06/22/15 0048  (1.727 m)     Head Cir --  Peak Flow --      Pain Score 06/22/15 0049 0     Pain Loc --      Pain Edu? --      Excl. in GC? --     Constitutional: Alert and oriented. Well appearing and in no acute distress. Eyes: Conjunctivae are normal. PERRL. EOMI. Head: Atraumatic. Nose: No congestion/rhinnorhea. Mouth/Throat: Mucous membranes are moist.  Oropharynx non-erythematous. Neck: No stridor.   Cardiovascular: Normal rate, regular rhythm. Grossly normal heart sounds.  Good peripheral circulation. Respiratory: Normal respiratory effort.  No retractions. Lungs CTAB. Gastrointestinal: Soft and nontender. No distention. No abdominal  bruits. No CVA tenderness. Musculoskeletal: No lower extremity tenderness nor edema.  No joint effusions. Neurologic:  Normal speech and language. No gross focal neurologic deficits are appreciated. Cranial nerves II through XII are intact cerebellar finger-nose alternating movements of the hands are normal heel to shin is normal side at 5 throughout and sensation is intact throughout Skin:  Skin is warm, dry and intact. No rash noted. Psychiatric: Mood and affect are normal. Speech and behavior are normal. Patient really does not want to spend the night in the hospital so she feels fine  ____________________________________________   LABS (all labs ordered are listed, but only abnormal results are displayed)  Labs Reviewed  URINALYSIS COMPLETEWITH MICROSCOPIC ([facility] ONLY) - Abnormal; Notable for the following:    Color, Urine YELLOW (*)    APPearance HAZY (*)    Hgb urine dipstick 1+ (*)    Protein, ur 100 (*)    Leukocytes, UA 1+ (*)    Bacteria, UA MANY (*)    Squamous Epithelial / LPF 6-30 (*)    All other components within normal limits  URINE DRUG SCREEN, QUALITATIVE ([facility] ONLY) - Abnormal; Notable for the following:    Tricyclic, Ur Screen NOT DETECTED (*)    Amphetamines, Ur Screen NOT DETECTED (*)    MDMA (Ecstasy)Ur Screen NOT DETECTED (*)    Cocaine Metabolite,Ur [place] NOT DETECTED (*)    Opiate, Ur Screen NOT DETECTED (*)    Phencyclidine (PCP) Ur S NOT DETECTED (*)    Cannabinoid 50 Ng, Ur [place] NOT DETECTED (*)    Barbiturates, Ur Screen NOT DETECTED (*)    Benzodiazepine, Ur Scrn POSITIVE (*)    Methadone Scn, Ur NOT DETECTED (*)    All other components within normal limits  URINE CULTURE  COMPREHENSIVE METABOLIC PANEL  CBC WITH DIFFERENTIAL/PLATELET  TROPONIN I  CREATININE, SERUM  CBG MONITORING, ED   ____________________________________________  EKG  EKG read and interpreted by me shows normal sinus rhythm at a rate of 66 left axis T's in half. Repeat EKG is  essentially the same. The computer keeps reading acute MI there is not any ST elevation in 1 and aVL which appears to be the problem computer. Patient has a EKG from 05/12/2012 looks the same and denies any chest pain ____________________________________________  RADIOLOGY  CT scan of the head is read as normal by radiology unfortunately the computer will not allow me to see the pictures x-ray of the chest is read as normal by radiology again the computer will not follow with his see the pictures its malfunctioning somehow again ____________________________________________   PROCEDURES   ____________________________________________   INITIAL IMPRESSION / ASSESSMENT AND PLAN / ED COURSE  Pertinent labs & imaging results that were available during my care of the patient were reviewed by me and considered in my medical decision making (see chart for details).  ____________________________________________   FINAL CLINICAL IMPRESSION(S) / ED DIAGNOSES  Final diagnoses:  Transient cerebral ischemia, unspecified transient cerebral ischemia type      Arnaldo Natal, MD 06/22/15 [phone]

## 2015-06-22 NOTE — Progress Notes (Signed)
Dr Luberta Mutter verbalized to place an order for discharge. MD to place discharge instructions.

## 2015-06-22 NOTE — Plan of Care (Signed)
Problem: Discharge/Transitional Outcomes Goal: Other Discharge Outcomes/Goals Outcome: Completed/Met Date Met:  06/22/15 VSS. No signs of pain. Neuro checks WDL during the shift. NIH score 0. Tolerates diet. MRI negative. Discharge instructions given and explained to pt. Family at the bedside.

## 2015-06-22 NOTE — Consult Note (Signed)
CC: slurry speech with diplopia   HPI: Laura Gordon is an 44 y.o. female with a known history of hyperlipidemia, GAD/depression, insomnia presents to the emergency room with the complaints of sudden onset of slurred speech with associated double vision lasting for 5 minutes and resolved completely. Information obtain from pt's husband as pt is still obtaining her testing. He believes she is at her baseline at this point. Laboratory work up and imaging reviewed.    Past Medical History  Diagnosis Date  . Depression     Past Surgical History  Procedure Laterality Date  . Cesarean section    . Tubal ligation    . 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 Endometrial ablation  10/25/2004  . Cesarean section N/A     Family History  Problem Relation Age of Onset  . Heart disease Mother 27  . Diabetes Mother   . Hypertension Mother   . Hyperlipidemia Mother   . Kidney disease Father 11  . Heart disease Father   . Diabetes Daughter 28    type 2   . Diabetes Son 12    type 1  . Diabetes Maternal Grandmother   . Heart disease Paternal Grandfather   . Hyperlipidemia Paternal Grandfather   . Hypertension Paternal Grandfather     Social History:  reports that she has quit smoking. Her smoking use included Cigarettes. She has a 20 pack-year smoking history. She has never used smokeless tobacco. She reports that she does not drink alcohol or use illicit drugs.  No Known Allergies  Medications: I have reviewed the patient's current medications.  ROS: History obtained from chart review  General ROS: negative for - chills, fatigue, fever, night sweats, weight gain or weight loss Psychological ROS: GAD, depression, axiety Ophthalmic ROS: negative for - blurry vision, double vision, eye pain or loss of vision ENT ROS: negative for - epistaxis, nasal discharge, oral lesions, sore throat, tinnitus or vertigo Allergy and Immunology ROS: negative for - hives or itchy/watery  eyes Hematological and Lymphatic ROS: negative for - bleeding problems, bruising or swollen lymph nodes Endocrine ROS: negative for - galactorrhea, hair pattern changes, polydipsia/polyuria or temperature intolerance Respiratory ROS: negative for - cough, hemoptysis, shortness of breath or wheezing Cardiovascular ROS: negative for - chest pain, dyspnea on exertion, edema or irregular heartbeat Gastrointestinal ROS: negative for - abdominal pain, diarrhea, hematemesis, nausea/vomiting or stool incontinence Genito-Urinary ROS: negative for - dysuria, hematuria, incontinence or urinary frequency/urgency Musculoskeletal ROS: negative for - joint swelling or muscular weakness Neurological ROS: as noted in HPI Dermatological ROS: negative for rash and skin lesion changes  Physical Examination: Blood pressure 109/66, pulse 63, temperature 98 F (36.7 C), temperature source Oral, resp. rate 18, height 5\' 8"  (1.727 m), weight 103.647 kg (228 lb 8 oz), last menstrual period 09/30/2014, SpO2 99 %.   Neurological Examination Mental Status: Alert, oriented, thought content appropriate.  Speech fluent without evidence of aphasia.  Able to follow 3 step commands without difficulty. Cranial Nerves: II: Discs flat bilaterally; Visual fields grossly normal, pupils equal, round, reactive to light and accommodation III,IV, VI: ptosis not present, extra-ocular motions intact bilaterally V,VII: smile symmetric, facial light touch sensation normal bilaterally VIII: hearing normal bilaterally IX,X: gag reflex present XI: bilateral shoulder shrug XII: midline tongue extension Motor: Right : Upper extremity   5/5    Left:     Upper extremity   5/5  Lower extremity   5/5     Lower extremity  5/5 Tone and bulk:normal tone throughout; no atrophy noted Sensory: Pinprick and light touch intact throughout, bilaterally Deep Tendon Reflexes: 1+ and symmetric throughout Plantars: Right: downgoing   Left:  downgoing Cerebellar: normal finger-to-nose, normal rapid alternating movements and normal heel-to-shin test Gait: not tested      Laboratory Studies:   Basic Metabolic Panel:  Recent Labs Lab 06/22/15 0112  NA 142  K 3.6  CL 103  CO2 30  GLUCOSE 96  BUN 18  CREATININE 0.81  0.81  CALCIUM 9.8    Liver Function Tests:  Recent Labs Lab 06/22/15 0112  AST 22  ALT 16  ALKPHOS 72  BILITOT 0.4  PROT 7.8  ALBUMIN 4.1   No results for input(s): LIPASE, AMYLASE in the last 168 hours. No results for input(s): AMMONIA in the last 168 hours.  CBC:  Recent Labs Lab 06/22/15 0112  WBC 8.7  NEUTROABS 5.2  HGB 13.2  HCT 40.7  MCV 85.5  PLT 201    Cardiac Enzymes:  Recent Labs Lab 06/22/15 0112  TROPONINI <0.03    BNP: Invalid input(s): POCBNP  CBG: No results for input(s): GLUCAP in the last 168 hours.  Microbiology: Results for orders placed or performed in visit on 05/03/14  Herpes simplex virus culture     Status: None   Collection Time: 05/03/14  5:41 PM  Result Value Ref Range Status   Organism ID, Bacteria No Herpes Simplex Virus detected.  Final    Coagulation Studies: No results for input(s): LABPROT, INR in the last 72 hours.  Urinalysis:  Recent Labs Lab 06/22/15 0132  COLORURINE YELLOW*  LABSPEC 1.023  PHURINE 5.0  GLUCOSEU NEGATIVE  HGBUR 1+*  BILIRUBINUR NEGATIVE  KETONESUR NEGATIVE  PROTEINUR 100*  NITRITE NEGATIVE  LEUKOCYTESUR 1+*    Lipid Panel:     Component Value Date/Time   CHOL 134 04/24/2015 1311   CHOL 107 04/07/2012 0747   TRIG 67.0 04/24/2015 1311   TRIG 135 04/07/2012 0747   HDL 44.80 04/24/2015 1311   HDL 26* 04/07/2012 0747   CHOLHDL 3 04/24/2015 1311   VLDL 13.4 04/24/2015 1311   VLDL 27 04/07/2012 0747   LDLCALC 76 04/24/2015 1311   LDLCALC 54 04/07/2012 0747    HgbA1C:  Lab Results  Component Value Date   HGBA1C 5.6 04/24/2015    Urine Drug Screen:     Component Value Date/Time    LABOPIA NOT DETECTED* 06/22/2015 0132   LABBENZ POSITIVE* 06/22/2015 0132   AMPHETMU NOT DETECTED* 06/22/2015 0132   THCU NOT DETECTED* 06/22/2015 0132   LABBARB NOT DETECTED* 06/22/2015 0132    Alcohol Level: No results for input(s): ETH in the last 168 hours.  Other results: EKG: normal EKG, normal sinus rhythm, unchanged from previous tracings.  Imaging: Dg Chest 2 View  06/22/2015   CLINICAL DATA:  EKG reads ST-elevation MI.  EXAM: CHEST  2 VIEW  COMPARISON:  Report from chest radiographs 05/12/2012, images not available.  FINDINGS: The heart is at the upper limits of normal in size, this was reported previously. The lungs are clear. Pulmonary vasculature is normal. No consolidation, pleural effusion, or pneumothorax. No acute osseous abnormalities are seen.  IMPRESSION: No acute pulmonary process.   Electronically Signed   By: Rubye Oaks M.D.   On: 06/22/2015 01:50   Ct Head Wo Contrast  06/22/2015   CLINICAL DATA:  Altered mental status. Slurred speech. Blurry vision.  EXAM: CT HEAD WITHOUT CONTRAST  TECHNIQUE: Contiguous axial images were  obtained from the base of the skull through the vertex without intravenous contrast.  COMPARISON:  06/21/2010  FINDINGS: No intracranial hemorrhage, mass effect, or midline shift. No hydrocephalus. The basilar cisterns are patent. No evidence of territorial infarct. No intracranial fluid collection. Calvarium is intact. Included paranasal sinuses and mastoid air cells are well aerated.  IMPRESSION: No acute intracranial abnormality.   Electronically Signed   By: Rubye Oaks M.D.   On: 06/22/2015 01:49     Assessment/Plan:  44 y.o. female with a known history of hyperlipidemia, GAD/depression, insomnia presents to the emergency room with the complaints of sudden onset of slurred speech with associated double vision lasting for 5 minutes and resolved completely. Information obtain from pt's husband as pt is still obtaining her testing. He  believes she is at her baseline at this point. Laboratory work up and imaging reviewed.    Pt is obtaining her stroke work up and imaging at this time. - If stroke work up negative likely d/c home today - this could be anxiety related - Con't ASA 81/statin at home - d/w pt's husband Pauletta Browns  06/22/2015, 10:15 AM

## 2015-06-22 NOTE — Progress Notes (Signed)
*  PRELIMINARY RESULTS* Echocardiogram 2D Echocardiogram has been performed.  Garrel Ridgel Stills 06/22/2015, 9:50 AM

## 2015-06-22 NOTE — ED Notes (Signed)
Patient transported to CT 

## 2015-06-24 ENCOUNTER — Telehealth: Payer: Self-pay | Admitting: *Deleted

## 2015-06-24 LAB — URINE CULTURE: Culture: >=100000

## 2015-06-24 NOTE — Telephone Encounter (Signed)
Transitional care call attempted.  Left message for patient to return call. 

## 2015-06-25 ENCOUNTER — Telehealth: Payer: Self-pay | Admitting: *Deleted

## 2015-06-25 MED ORDER — CIPROFLOXACIN HCL 250 MG PO TABS: 250.0000 mg | ORAL_TABLET | Freq: Two times a day (BID) | ORAL | Status: DC

## 2015-06-25 NOTE — Telephone Encounter (Signed)
Laura Gordon was recently in the hospital and urine culture was positive for E Coli.  Spoke with patient and she was not treated for UTI while admitted.  Prescription for Cipro sent to [facility] [address] in [place].  Patient is aware antibiotics have been sent to her pharmacy.

## 2015-06-25 NOTE — Telephone Encounter (Signed)
-----   Message from Hannah Beat, MD sent at 06/25/2015 12:03 PM EDT ----- cipro 250 mg, 1 po bid, #14

## 2015-06-26 NOTE — Telephone Encounter (Signed)
Transition Care Management Follow-up Telephone Call   Date discharged? 06/22/15   How have you been since you were released from the hospital? Doing well, improved.   Do you understand why you were in the hospital? yes   Do you understand the discharge instructions? yes   Where were you discharged to? Home   Items Reviewed:  Medications reviewed: yes  Allergies reviewed: yes  Dietary changes reviewed: no  Referrals reviewed: yes, neurology/cardiology   Functional Questionnaire:   Activities of Daily Living (ADLs):   She states they are independent in the following: ambulation, bathing and hygiene, feeding, continence, grooming, toileting and dressing States they require assistance with the following: None   Any transportation issues/concerns?: no   Any patient concerns? no   Confirmed importance and date/time of follow-up visits scheduled yes, will schedule pending discussion with Dr. Ermalene Searing    Confirmed with patient if condition begins to worsen call PCP or go to the ER.  Patient was given the office number and encouraged to call back with question or concerns.  : yes

## 2015-06-26 NOTE — Telephone Encounter (Signed)
Yes that will be okay but if there is a 15 min appt earlier in afternoon can you block it?

## 2015-06-26 NOTE — Telephone Encounter (Signed)
Due to her new work schedule, patient is not able to coming until 4 pm for follow up.  This is currently a 15 minute time slot.  Are you okay with scheduling this type of appointment at 4?  She needs to be seen next week.    *I will be out of the office 9/2 - 9/7, please route to CMA to schedule*

## 2015-06-27 NOTE — Telephone Encounter (Signed)
Scheduled patient for TCM follow-up visit 07/03/15 at 4pm.  Blocked an earlier 15 minute visit that afternoon per Dr. Daphine Deutscher request.  Left VM for patient about her appointment.

## 2015-06-28 NOTE — Discharge Summary (Signed)
Laura Gordon, is a 44 y.o. female  DOB 07-27-1971  MRN [id number].  Admission date:  06/22/2015  Admitting Physician  Crissie Figures, MD  Discharge Date:  06/22/2015   Primary MD  Kerby Nora, MD  Recommendations for primary care physician for things to follow:   Follow-up  With  primary doctor in 1 week.   Admission Diagnosis  Transient cerebral ischemia, unspecified transient cerebral ischemia type [G45.9]   Discharge Diagnosis  Transient cerebral ischemia, unspecified transient cerebral ischemia type [G45.9]    Principal Problem:   TIA (transient ischemic attack)      Past Medical History  Diagnosis Date  . Depression     Past Surgical History  Procedure Laterality Date  . Cesarean section    . Tubal ligation    . 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 Endometrial ablation  10/25/2004  . Cesarean section N/A        History of present illness and  Hospital Course:     Kindly see H&P for history of present illness and admission details, please review complete Labs, Consult reports and Test reports for all details in brief  HPI  from the history and physical done on the day of admission  44 year old female patient with hyperlipidemia, depression, insomnia came in secondary to double vision, slurred speech. Patient symptoms resolved by the time she came to the ER. Patient to EKG showed normal sinus rhythm ,. Chest x-ray did not show any cardiac or pulmonary pathology. Admitted to hospitalist service for TIA evaluation.  Hospital Course   #1 slurred speech, double vision and transient: Patient CT of the head unremarkable, MRI of the brain did not show any acute stroke.Ultrasound of the carotids did not show any stenosis.. Echocardiogram study of more than 55%. Patient symptoms did not recur by  she was in the hospital. Patient to seen by neurology DrZeylikman  he suggested that symptoms could be anxiety related. The same is discussed with the patient. MRI brain and other lab results were discussed with the patient. Chart home in stable condition. 2 history of depression: Takes Xanax at home advised to continue that. Also on Zoloft advised to continue that. Hyperlipidemia continue Lipitor 40 mg daily  Culture results came back positive after patient was discharged. Patient was also not symptomatic. Patient was called in for antibiotics by a primary doctor's medical assistant.  Discharge Condition: Stable   Follow UP Advised to follow with primary doctor.     Discharge Instructions  and  Discharge Medications        Medication List    TAKE these medications        ALPRAZolam 0.25 MG tablet  Commonly known as:  XANAX  Take 1 tablet (0.25 mg total) by mouth 2 (two) times daily. Take one by mouth twice a day as needed for anxiety     aspirin 81 MG EC tablet  Take 1 tablet (81 mg total) by mouth daily.     atorvastatin 40 MG tablet  Commonly known as:  LIPITOR  Take 1 tablet (40 mg total) by mouth daily.     desloratadine-pseudoephedrine 2.5-120 MG per tablet  Commonly known as:  CLARINEX-D 12-hour  Take 1 tablet by mouth 2 (two) times daily.     diphenhydrAMINE 25 MG tablet  Commonly known as:  SOMINEX  Take 25 mg by mouth at bedtime as needed for sleep.     fluticasone 50 MCG/ACT nasal spray  Commonly known  as:  FLONASE  Place 2 sprays into both nostrils daily.     Melatonin 5 MG Tabs  Take 1-2 tablets by mouth daily as needed.     multivitamin tablet  Take 1 tablet by mouth daily.     sertraline 50 MG tablet  Commonly known as:  ZOLOFT  TAKE 1 TABLET (50 MG TOTAL) BY MOUTH DAILY.     zolpidem 10 MG tablet  Commonly known as:  AMBIEN  Take 1 tablet (10 mg total) by mouth at bedtime as needed for sleep.          Diet and Activity recommendation:  See Discharge Instructions above   Consults obtained -neurology   Major procedures and Radiology Reports - PLEASE review detailed and final reports for all details, in brief -      Dg Chest 2 View  06/22/2015   CLINICAL DATA:  EKG reads ST-elevation MI.  EXAM: CHEST  2 VIEW  COMPARISON:  Report from chest radiographs 05/12/2012, images not available.  FINDINGS: The heart is at the upper limits of normal in size, this was reported previously. The lungs are clear. Pulmonary vasculature is normal. No consolidation, pleural effusion, or pneumothorax. No acute osseous abnormalities are seen.  IMPRESSION: No acute pulmonary process.   Electronically Signed   By: Rubye Oaks M.D.   On: 06/22/2015 01:50   Ct Head Wo Contrast  06/22/2015   CLINICAL DATA:  Altered mental status. Slurred speech. Blurry vision.  EXAM: CT HEAD WITHOUT CONTRAST  TECHNIQUE: Contiguous axial images were obtained from the base of the skull through the vertex without intravenous contrast.  COMPARISON:  06/21/2010  FINDINGS: No intracranial hemorrhage, mass effect, or midline shift. No hydrocephalus. The basilar cisterns are patent. No evidence of territorial infarct. No intracranial fluid collection. Calvarium is intact. Included paranasal sinuses and mastoid air cells are well aerated.  IMPRESSION: No acute intracranial abnormality.   Electronically Signed   By: Rubye Oaks M.D.   On: 06/22/2015 01:49   Mr Brain Wo Contrast  06/22/2015   CLINICAL DATA:  TIA.  History diabetes and hypertension  EXAM: MRI HEAD WITHOUT CONTRAST  TECHNIQUE: Multiplanar, multiecho pulse sequences of the brain and surrounding structures were obtained without intravenous contrast.  COMPARISON:  CT head 06/22/2015  FINDINGS: Ventricle size and cerebral volume normal. Pituitary normal in size. Negative for Chiari malformation.  Negative for acute infarct. No significant chronic ischemic change. Negative for demyelinating disease. Cerebral white  matter is normal. Brainstem and cerebellum normal.  Negative for intracranial hemorrhage.  Negative for mass or edema.  Paranasal sinuses clear.  Orbits normal.  Calvarium intact.  IMPRESSION: Normal unenhanced MRI of the brain.   Electronically Signed   By: Marlan Palau M.D.   On: 06/22/2015 12:52   US Carotid Bilateral  06/22/2015   CLINICAL DATA:  Transient ischemic attack symptoms, hyperlipidemia  EXAM: BILATERAL CAROTID DUPLEX ULTRASOUND  TECHNIQUE: Wallace Cullens scale imaging, color Doppler and duplex ultrasound were performed of bilateral carotid and vertebral arteries in the neck.  COMPARISON:  None.  FINDINGS: Criteria: Quantification of carotid stenosis is based on velocity parameters that correlate the residual internal carotid diameter with NASCET-based stenosis levels, using the diameter of the distal internal carotid lumen as the denominator for stenosis measurement.  The following velocity measurements were obtained:  RIGHT  ICA:  88/39 cm/sec  CCA:  93/34 cm/sec  SYSTOLIC ICA/CCA RATIO:  0.9  DIASTOLIC ICA/CCA RATIO:  1.2  ECA:  97 cm/sec  LEFT  ICA:  82/36 cm/sec  CCA:  78/35 cm/sec  SYSTOLIC ICA/CCA RATIO:  1.1  DIASTOLIC ICA/CCA RATIO:  1.1  ECA:  80 cm/sec  RIGHT CAROTID ARTERY: Minor intimal thickening. No significant plaque formation. No hemodynamically significant right ICA stenosis, velocity elevation, or turbulent flow. Degree of narrowing less than 50%.  RIGHT VERTEBRAL ARTERY:  Antegrade  LEFT CAROTID ARTERY: Similar scattered minor intimal thickening. No significant plaque formation. No hemodynamically significant left ICA stenosis, velocity elevation, or turbulent flow.  LEFT VERTEBRAL ARTERY:  Antegrade  IMPRESSION: Minor carotid intimal thickening. No significant atherosclerosis. No hemodynamically significant ICA stenosis by ultrasound.   Electronically Signed   By: Judie Petit.  Shick M.D.   On: 06/22/2015 11:59    Micro Results     Recent Results (from the past 240 hour(s))  Urine culture      Status: None   Collection Time: 06/22/15  1:32 AM  Result Value Ref Range Status   Specimen Description URINE, RANDOM  Final   Special Requests NONE  Final   Culture >=100,000 COLONIES/mL ESCHERICHIA COLI  Final   Report Status 06/24/2015 FINAL  Final   Organism ID, Bacteria ESCHERICHIA COLI  Final      Susceptibility   Escherichia coli - MIC*    AMPICILLIN <=2 SENSITIVE Sensitive     CEFTAZIDIME <=1 SENSITIVE Sensitive     CEFAZOLIN <=4 SENSITIVE Sensitive     CEFTRIAXONE <=1 SENSITIVE Sensitive     CIPROFLOXACIN <=0.25 SENSITIVE Sensitive     GENTAMICIN <=1 SENSITIVE Sensitive     IMIPENEM <=0.25 SENSITIVE Sensitive     TRIMETH/SULFA <=20 SENSITIVE Sensitive     NITROFURANTOIN Value in next row Sensitive      SENSITIVE<=16    PIP/TAZO Value in next row Sensitive      SENSITIVE<=4    * >=100,000 COLONIES/mL ESCHERICHIA COLI       Today   Subjective:   Anjolie Majer today has no headache,no chest abdominal pain,no new weakness tingling or numbness, feels much better wants to go home today.   Objective:   Blood pressure 142/89, pulse 69, temperature 98.6 F (37 C), temperature source Oral, resp. rate 20, height 5\' 8"  (1.727 m), weight 103.647 kg (228 lb 8 oz), last menstrual period 09/30/2014, SpO2 99 %.  No intake or output data in the 24 hours ending 06/28/15 1233  Exam Awake Alert, Oriented x 3, No new F.N deficits, Normal affect [place].AT,PERRAL Supple Neck,No JVD, No cervical lymphadenopathy appriciated.  Symmetrical Chest wall movement, Good air movement bilaterally, CTAB RRR,No Gallops,Rubs or new Murmurs, No Parasternal Heave +ve B.Sounds, Abd Soft, Non tender, No organomegaly appriciated, No rebound -guarding or rigidity. No Cyanosis, Clubbing or edema, No new Rash or bruise  Data Review   CBC w Diff:  Lab Results  Component Value Date   WBC 8.7 06/22/2015   WBC 9.1 02/02/2013   HGB 13.2 06/22/2015   HGB 12.5 02/02/2013   HCT 40.7 06/22/2015   HCT  37.6 02/02/2013   PLT 201 06/22/2015   PLT 203 02/02/2013   LYMPHOPCT 30 06/22/2015   LYMPHOPCT 34.5 02/02/2013   MONOPCT 8 06/22/2015   MONOPCT 7.1 02/02/2013   EOSPCT 1 06/22/2015   EOSPCT 1.0 02/02/2013   BASOPCT 1 06/22/2015   BASOPCT 0.9 02/02/2013    CMP:  Lab Results  Component Value Date   NA 142 06/22/2015   NA 137 02/02/2013   K 3.6 06/22/2015   K 3.1* 02/02/2013   CL 103 06/22/2015  CL 100 02/02/2013   CO2 30 06/22/2015   CO2 31 02/02/2013   BUN 18 06/22/2015   BUN 6* 02/02/2013   CREATININE 0.81 06/22/2015   CREATININE 0.81 06/22/2015   CREATININE 0.78 02/02/2013   PROT 7.8 06/22/2015   PROT 7.6 02/02/2013   ALBUMIN 4.1 06/22/2015   ALBUMIN 3.7 02/02/2013   BILITOT 0.4 06/22/2015   BILITOT 0.4 02/02/2013   ALKPHOS 72 06/22/2015   ALKPHOS 72 02/02/2013   AST 22 06/22/2015   AST 15 02/02/2013   ALT 16 06/22/2015   ALT 16 02/02/2013  .   Total Time in preparing paper work, data evaluation and todays exam - 35 minutes  Yama Nielson M.D on 06/22/2015 at 12:33 PM

## 2015-07-02 ENCOUNTER — Other Ambulatory Visit: Payer: Self-pay | Admitting: Family Medicine

## 2015-07-03 ENCOUNTER — Ambulatory Visit: Payer: PRIVATE HEALTH INSURANCE | Admitting: Family Medicine

## 2015-07-03 MED ORDER — ZOLPIDEM TARTRATE 10 MG PO TABS: 10.0000 mg | ORAL_TABLET | Freq: Every evening | ORAL | Status: DC | PRN

## 2015-07-03 NOTE — Telephone Encounter (Signed)
Called to [facility] [address] 

## 2015-07-03 NOTE — Telephone Encounter (Signed)
Patient is scheduled today for hospital follow up.. Last refilled 06/03/2015 for #30 with no refills.  Ok to refill?

## 2015-07-09 ENCOUNTER — Other Ambulatory Visit: Payer: Self-pay | Admitting: Family Medicine

## 2015-07-09 NOTE — Telephone Encounter (Signed)
Last office visit 04/24/2015.  Last refilled 05/20/2015 for #60 with no refills.  Ok to refill?

## 2015-07-10 ENCOUNTER — Other Ambulatory Visit: Payer: Self-pay | Admitting: Family Medicine

## 2015-07-10 MED ORDER — ALPRAZOLAM 0.25 MG PO TABS: 0.2500 mg | ORAL_TABLET | Freq: Two times a day (BID) | ORAL | Status: DC

## 2015-07-10 NOTE — Telephone Encounter (Signed)
Alprazolam called into [facility] [address] 

## 2015-07-18 ENCOUNTER — Encounter: Payer: Self-pay | Admitting: Family Medicine

## 2015-07-18 ENCOUNTER — Ambulatory Visit: Payer: Self-pay | Admitting: Family Medicine

## 2015-07-18 ENCOUNTER — Ambulatory Visit (INDEPENDENT_AMBULATORY_CARE_PROVIDER_SITE_OTHER): Payer: PRIVATE HEALTH INSURANCE | Admitting: Family Medicine

## 2015-07-18 VITALS — BP 120/90 | HR 87 | Temp 98.6°F | Ht 68.75 in | Wt 231.0 lb

## 2015-07-18 DIAGNOSIS — F411 Generalized anxiety disorder: Secondary | ICD-10-CM | POA: Diagnosis not present

## 2015-07-18 DIAGNOSIS — E7800 Pure hypercholesterolemia, unspecified: Secondary | ICD-10-CM

## 2015-07-18 DIAGNOSIS — G459 Transient cerebral ischemic attack, unspecified: Secondary | ICD-10-CM | POA: Diagnosis not present

## 2015-07-18 DIAGNOSIS — R4781 Slurred speech: Secondary | ICD-10-CM | POA: Diagnosis not present

## 2015-07-18 NOTE — Progress Notes (Signed)
Pre visit review using our clinic review tool, if applicable. No additional management support is needed unless otherwise documented below in the visit note. 

## 2015-07-18 NOTE — Assessment & Plan Note (Signed)
Doubt TIA as work up negative, but will treat as such with baby aspirin., risk factor modification.

## 2015-07-18 NOTE — Patient Instructions (Addendum)
Continue baby aspirin.  Keep working on healthy eating and weight loss.  Follow BP at home.  Do not take Ambien and xanax togetther.

## 2015-07-18 NOTE — Assessment & Plan Note (Signed)
Well controlled per pt on current meds.

## 2015-07-18 NOTE — Assessment & Plan Note (Addendum)
Lab Results  Component Value Date   CHOL 134 04/24/2015   HDL 44.80 04/24/2015   LDLCALC 76 04/24/2015   LDLDIRECT 228.0 07/03/2013   TRIG 67.0 04/24/2015   CHOLHDL 3 04/24/2015    LDL almost at new goal < 70 at last check in 03/2015.

## 2015-07-18 NOTE — Assessment & Plan Note (Signed)
Possible TIA but less likely I believe than SE to taking xanax and ambien together on a day after a funeral when she was dehydrated and overheated.  No further issues.

## 2015-07-18 NOTE — Progress Notes (Signed)
Subjective:    Patient ID: Laura Gordon, female    DOB: 05-Mar-1971, 44 y.o.   MRN: [id number]  HPI   44 year old female presents for [facility] follow up.  She was admitted as well as discharged on 06/22/2015.  Discharge Diagnosis Transient cerebral ischemia Hospital course: She came in  To ER secondary to double vision, slurred speech. Patient symptoms resolved by the time she came to the ER ( lasted 5 min). Patient  EKG showed normal sinus rhythm ( St changes but nstable from 2013), Chest x-ray did not show any cardiac or pulmonary pathology. Admitted to hospitalist service for TIA evaluation. Patient CT of the head unremarkable, MRI of the brain did not show any acute stroke.Ultrasound of the carotids did not show any stenosis.. Echocardiogram study of more than 55%. Patient symptoms did not recur by she was in the hospital. Patient  seen by neurology Dr. Loretha Brasil he suggested that symptoms could be anxiety related. Advised to continue zoloft  50 mg daily and xanax daily at bedtime prn.  She does think she took Ambien and xanax both at soma time that night. She does not usually do that.  Was in heat earlier that day.   Since she has been home she feels well.  No chest pain, no SOB. No fatigue.  No further slurred speech, double vision.  She denies  Poor control of anxiety and depression  Started on baby aspirin in hospital.   Family history of CAD ager 24. Last stress test  2013 low risk  2/201616 LDL 76, nonsmoker HDL > 40.  A1C 5.6 No PVD, no carotid stenosis.  She is on baby aspirin.  BP Readings from Last 3 Encounters:  07/18/15 120/90  06/22/15 142/89  04/24/15 122/90  At home BP  120/60-80   Social History /Family History/Past Medical History reviewed and updated if needed.   Review of Systems  Constitutional: Negative for fever and fatigue.  HENT: Negative for ear pain.   Eyes: Negative for pain.  Respiratory: Negative for chest tightness and  shortness of breath.   Cardiovascular: Negative for chest pain, palpitations and leg swelling.  Gastrointestinal: Negative for abdominal pain.  Genitourinary: Negative for dysuria.       Objective:   Physical Exam  Constitutional: She is oriented to person, place, and time. Vital signs are normal. She appears well-developed and well-nourished. She is cooperative.  Non-toxic appearance. She does not appear ill. No distress.  HENT:  Head: Normocephalic.  Right Ear: Hearing, tympanic membrane, external ear and ear canal normal. Tympanic membrane is not erythematous, not retracted and not bulging.  Left Ear: Hearing, tympanic membrane, external ear and ear canal normal. Tympanic membrane is not erythematous, not retracted and not bulging.  Nose: No mucosal edema or rhinorrhea. Right sinus exhibits no maxillary sinus tenderness and no frontal sinus tenderness. Left sinus exhibits no maxillary sinus tenderness and no frontal sinus tenderness.  Mouth/Throat: Uvula is midline, oropharynx is clear and moist and mucous membranes are normal.  Eyes: Conjunctivae, EOM and lids are normal. Pupils are equal, round, and reactive to light. Lids are everted and swept, no foreign bodies found.  Neck: Trachea normal and normal range of motion. Neck supple. Carotid bruit is not present. No thyroid mass and no thyromegaly present.  Cardiovascular: Normal rate, regular rhythm, S1 normal, S2 normal, normal heart sounds, intact distal pulses and normal pulses.  Exam reveals no gallop and no friction rub.   No murmur heard. Pulmonary/Chest:  Effort normal and breath sounds normal. No tachypnea. No respiratory distress. She has no decreased breath sounds. She has no wheezes. She has no rhonchi. She has no rales.  Abdominal: Soft. Normal appearance and bowel sounds are normal. There is no tenderness.  Neurological: She is alert and oriented to person, place, and time. She has normal strength and normal reflexes. No cranial  nerve deficit or sensory deficit. She exhibits normal muscle tone. She displays a negative Romberg sign. Coordination and gait normal. GCS eye subscore is 4. GCS verbal subscore is 5. GCS motor subscore is 6.  Nml cerebellar exam   No papilledema  Skin: Skin is warm, dry and intact. No rash noted.  Psychiatric: She has a normal mood and affect. Her speech is normal and behavior is normal. Judgment and thought content normal. Her mood appears not anxious. Cognition and memory are normal. Cognition and memory are not impaired. She does not exhibit a depressed mood. She exhibits normal recent memory and normal remote memory.          Assessment & Plan:

## 2015-08-11 ENCOUNTER — Other Ambulatory Visit: Payer: Self-pay | Admitting: Family Medicine

## 2015-08-11 NOTE — Telephone Encounter (Signed)
Last filled 07/03/15 and last OV with you was 07/18/15--please advise

## 2015-08-12 ENCOUNTER — Other Ambulatory Visit: Payer: Self-pay | Admitting: Family Medicine

## 2015-08-12 MED ORDER — ZOLPIDEM TARTRATE 10 MG PO TABS: 10.0000 mg | ORAL_TABLET | Freq: Every evening | ORAL | Status: DC | PRN

## 2015-08-12 NOTE — Telephone Encounter (Deleted)
Last office visit 07/18/2015.  Last refilled 07/10/2015 for #60 with no refills.  Ok to refill?

## 2015-08-12 NOTE — Telephone Encounter (Signed)
Zolpidem called into [facility] [address]

## 2015-09-14 ENCOUNTER — Other Ambulatory Visit: Payer: Self-pay | Admitting: Family Medicine

## 2015-09-15 NOTE — Telephone Encounter (Signed)
Last office visit 07/18/2015.  Ok to refill?

## 2015-09-16 ENCOUNTER — Other Ambulatory Visit: Payer: Self-pay | Admitting: Family Medicine

## 2015-09-16 MED ORDER — ALPRAZOLAM 0.25 MG PO TABS: 0.2500 mg | ORAL_TABLET | Freq: Two times a day (BID) | ORAL | Status: DC

## 2015-09-16 MED ORDER — ZOLPIDEM TARTRATE 10 MG PO TABS: 10.0000 mg | ORAL_TABLET | Freq: Every evening | ORAL | Status: DC | PRN

## 2015-09-16 NOTE — Telephone Encounter (Signed)
Alprazolam and Zolpidem called into [facility] - [place] ([place]), [place] - [address]

## 2015-10-21 ENCOUNTER — Telehealth: Payer: Self-pay | Admitting: Family Medicine

## 2015-10-21 DIAGNOSIS — Z8639 Personal history of other endocrine, nutritional and metabolic disease: Secondary | ICD-10-CM

## 2015-10-21 DIAGNOSIS — E7800 Pure hypercholesterolemia, unspecified: Secondary | ICD-10-CM

## 2015-10-21 NOTE — Telephone Encounter (Signed)
-----   Message from Alvina Chouerri J Walsh sent at 10/09/2015 12:21 PM EST ----- Regarding: Lab orders for Wednesday, 12.28.16 Lab orders for a 6 month follow up appt

## 2015-10-22 ENCOUNTER — Encounter: Payer: Self-pay | Admitting: Family Medicine

## 2015-10-22 ENCOUNTER — Ambulatory Visit (INDEPENDENT_AMBULATORY_CARE_PROVIDER_SITE_OTHER): Payer: PRIVATE HEALTH INSURANCE | Admitting: Family Medicine

## 2015-10-22 ENCOUNTER — Other Ambulatory Visit (INDEPENDENT_AMBULATORY_CARE_PROVIDER_SITE_OTHER): Payer: PRIVATE HEALTH INSURANCE

## 2015-10-22 VITALS — BP 110/80 | HR 100 | Temp 98.3°F | Wt 233.8 lb

## 2015-10-22 DIAGNOSIS — E7800 Pure hypercholesterolemia, unspecified: Secondary | ICD-10-CM | POA: Diagnosis not present

## 2015-10-22 DIAGNOSIS — Z8639 Personal history of other endocrine, nutritional and metabolic disease: Secondary | ICD-10-CM | POA: Diagnosis not present

## 2015-10-22 DIAGNOSIS — B9789 Other viral agents as the cause of diseases classified elsewhere: Principal | ICD-10-CM

## 2015-10-22 DIAGNOSIS — J069 Acute upper respiratory infection, unspecified: Secondary | ICD-10-CM | POA: Insufficient documentation

## 2015-10-22 LAB — LIPID PANEL
CHOLESTEROL: 138 mg/dL (ref 0–200)
HDL: 40 mg/dL (ref >39.00)
LDL Cholesterol: 68 mg/dL (ref 0–99)
NONHDL: 98.15
TRIGLYCERIDES: 153 mg/dL — AB (ref 0.0–149.0)
Total CHOL/HDL Ratio: 3
VLDL: 30.6 mg/dL (ref 0.0–40.0)

## 2015-10-22 LAB — COMPREHENSIVE METABOLIC PANEL
ALBUMIN: 3.8 g/dL (ref 3.5–5.2)
ALK PHOS: 68 U/L (ref 39–117)
ALT: 15 U/L (ref 0–35)
AST: 19 U/L (ref 0–37)
BUN: 11 mg/dL (ref 6–23)
CALCIUM: 9.3 mg/dL (ref 8.4–10.5)
CHLORIDE: 103 meq/L (ref 96–112)
CO2: 29 mEq/L (ref 19–32)
Creatinine, Ser: 0.81 mg/dL (ref 0.40–1.20)
GFR: 97.88 mL/min (ref >60.00)
Glucose, Bld: 174 mg/dL — ABNORMAL HIGH (ref 70–99)
POTASSIUM: 3.5 meq/L (ref 3.5–5.1)
SODIUM: 139 meq/L (ref 135–145)
TOTAL PROTEIN: 7.4 g/dL (ref 6.0–8.3)
Total Bilirubin: 0.4 mg/dL (ref 0.2–1.2)

## 2015-10-22 LAB — HEMOGLOBIN A1C: HEMOGLOBIN A1C: 5.9 % (ref 4.6–6.5)

## 2015-10-22 MED ORDER — SERTRALINE HCL 50 MG PO TABS: ORAL_TABLET | ORAL | Status: DC

## 2015-10-22 MED ORDER — GUAIFENESIN-CODEINE 100-10 MG/5ML PO SYRP: 5.0000 mL | ORAL_SOLUTION | Freq: Two times a day (BID) | ORAL | Status: DC | PRN

## 2015-10-22 MED ORDER — FLUTICASONE PROPIONATE 50 MCG/ACT NA SUSP: 2.0000 | Freq: Every day | NASAL | Status: DC

## 2015-10-22 MED ORDER — ZOLPIDEM TARTRATE 10 MG PO TABS: 10.0000 mg | ORAL_TABLET | Freq: Every evening | ORAL | Status: DC | PRN

## 2015-10-22 NOTE — Progress Notes (Signed)
BP 110/80 mmHg  Pulse 100  Temp(Src) 98.3 F (36.8 C) (Oral)  Wt 233 lb 12 oz (106.028 kg)  SpO2 98%   CC: cough, congestion  Subjective:    Patient ID: Laura Gordon, female    DOB: 04/27/1971, 44 y.o.   MRN: [id number]  HPI: Laura Gordon is a 44 y.o. female presenting on 10/22/2015 for Sinusitis   ST started 2 days ago, along with nasal stuffiness and head congestion, cough and bilateral ear pain that started yesterday. Tmax 100 last night. + PNdrainage. Dry cough. Congestion keeping her up.  No chills, tooth pain, dyspnea or wheezing, headache.   + grandson and other family members sick at home.  So far has tried pseudophed, mucinex, and nyquil. No smokers at home. No h/o asthma. + seasonal allergies but currently not bothering her. Not taking her flonase (ran out).  Requests refill of ambien today.  Relevant past medical, surgical, family and social history reviewed and updated as indicated. Interim medical history since our last visit reviewed. Allergies and medications reviewed and updated. Current Outpatient Prescriptions on File Prior to Visit  Medication Sig  . ALPRAZolam (XANAX) 0.25 MG tablet Take 1 tablet (0.25 mg total) by mouth 2 (two) times daily. Take one by mouth twice a day as needed for anxiety  . aspirin EC 81 MG EC tablet Take 1 tablet (81 mg total) by mouth daily.  Marland Kitchen atorvastatin (LIPITOR) 40 MG tablet Take 1 tablet (40 mg total) by mouth daily.  . Multiple Vitamin (MULTIVITAMIN) tablet Take 1 tablet by mouth daily.    No current facility-administered medications on file prior to visit.    Review of Systems Per HPI unless specifically indicated in ROS section     Objective:    BP 110/80 mmHg  Pulse 100  Temp(Src) 98.3 F (36.8 C) (Oral)  Wt 233 lb 12 oz (106.028 kg)  SpO2 98%  Wt Readings from Last 3 Encounters:  10/22/15 233 lb 12 oz (106.028 kg)  07/18/15 231 lb (104.781 kg)  06/22/15 228 lb 8 oz (103.647 kg)    Physical Exam    Constitutional: She appears well-developed and well-nourished. No distress.  HENT:  Head: Normocephalic and atraumatic.  Right Ear: Hearing, tympanic membrane, external ear and ear canal normal.  Left Ear: Hearing, external ear and ear canal normal.  Nose: Mucosal edema present. No rhinorrhea. Right sinus exhibits maxillary sinus tenderness. Right sinus exhibits no frontal sinus tenderness. Left sinus exhibits maxillary sinus tenderness. Left sinus exhibits no frontal sinus tenderness.  Mouth/Throat: Uvula is midline and mucous membranes are normal. Posterior oropharyngeal edema and posterior oropharyngeal erythema present. No oropharyngeal exudate or tonsillar abscesses.  Mild irritation of L TM with fluid behind eardrum Posterior oropharyngeal cobblestoning  Eyes: Conjunctivae and EOM are normal. Pupils are equal, round, and reactive to light. No scleral icterus.  Neck: Normal range of motion. Neck supple.  Cardiovascular: Normal rate, regular rhythm, normal heart sounds and intact distal pulses.   No murmur heard. Pulmonary/Chest: Effort normal and breath sounds normal. No respiratory distress. She has no wheezes. She has no rales.  Lymphadenopathy:       Head (right side): Submandibular adenopathy present. No submental, no tonsillar, no preauricular and no posterior auricular adenopathy present.       Head (left side): Submandibular adenopathy present. No submental, no tonsillar, no preauricular and no posterior auricular adenopathy present.    She has no cervical adenopathy.  Skin: Skin is warm and dry.  No rash noted.  Nursing note and vitals reviewed.     Assessment & Plan:   Problem List Items Addressed This Visit    Viral URI with cough - Primary    Discussed illness and anticipated course of improvement. Avoid NSAIDs 2/2 h/o bariatric surgery. Treat with restarting flonase, tylenol, cheratussin, and may continue pseudophed.  Update if sxs persist or fail to improve. Discussed  in details sxs to indicate bacterial infection and asked her to update us if this occurs.          Follow up plan: Return if symptoms worsen or fail to improve.

## 2015-10-22 NOTE — Progress Notes (Signed)
Pre visit review using our clinic review tool, if applicable. No additional management support is needed unless otherwise documented below in the visit note. 

## 2015-10-22 NOTE — Assessment & Plan Note (Addendum)
Discussed illness and anticipated course of improvement. Avoid NSAIDs 2/2 h/o bariatric surgery. Treat with restarting flonase, tylenol, cheratussin, and may continue pseudophed.  Update if sxs persist or fail to improve. Discussed in details sxs to indicate bacterial infection and asked her to update us if this occurs.

## 2015-10-22 NOTE — Patient Instructions (Signed)
You have a viral upper respiratory infection. Antibiotics are not needed for this.  Viral infections usually take 7-10 days to resolve.  The cough can last a few weeks to go away. Use medication as prescribed: cheratussin for night time and restart flonase.  Take tylenol for discomfort. May continue pseudophed for congestion. Push fluids and plenty of rest. Please return if you are not improving as expected, or if you have high fevers (>101.5) or difficulty swallowing or worsening productive cough. Call clinic with questions.  Good to see you today. I hope you start feeling better soon.

## 2015-10-24 ENCOUNTER — Ambulatory Visit: Payer: PRIVATE HEALTH INSURANCE | Admitting: Family Medicine

## 2015-10-24 ENCOUNTER — Telehealth: Payer: Self-pay | Admitting: Family Medicine

## 2015-10-24 NOTE — Telephone Encounter (Signed)
No follow up needed

## 2015-10-24 NOTE — Telephone Encounter (Signed)
Pt did not come in for their appt today for a follow up. Please let me know if pt needs to be contacted immediately for follow up or no follow up needed. Best phone number to contact pt is [phone].

## 2015-11-07 ENCOUNTER — Encounter: Payer: Self-pay | Admitting: Family Medicine

## 2015-11-10 ENCOUNTER — Other Ambulatory Visit: Payer: Self-pay | Admitting: Family Medicine

## 2015-11-11 MED ORDER — ALPRAZOLAM 0.25 MG PO TABS: 0.2500 mg | ORAL_TABLET | Freq: Two times a day (BID) | ORAL | Status: DC

## 2015-11-11 MED ORDER — ZOLPIDEM TARTRATE 10 MG PO TABS: 10.0000 mg | ORAL_TABLET | Freq: Every evening | ORAL | Status: DC | PRN

## 2015-11-11 NOTE — Telephone Encounter (Signed)
Zolpidem called into [facility] [address] 

## 2015-11-11 NOTE — Telephone Encounter (Addendum)
Alprazolam called into [facility] [address]

## 2015-11-11 NOTE — Telephone Encounter (Signed)
Last office visit 10/22/2015 with Dr. Reece Agar.  Last refilled 09/16/2015 for #60 with no refills.  Ok to refill?

## 2015-11-11 NOTE — Telephone Encounter (Signed)
Last office visit 10/22/2015 with Dr. Reece Agar.  Last refilled 10/22/2015 for #30 with no refills by Dr. Reece Agar.  Refill?

## 2015-12-24 ENCOUNTER — Other Ambulatory Visit: Payer: Self-pay | Admitting: Family Medicine

## 2015-12-25 ENCOUNTER — Other Ambulatory Visit: Payer: Self-pay | Admitting: Family Medicine

## 2015-12-25 MED ORDER — ALPRAZOLAM 0.25 MG PO TABS: 0.2500 mg | ORAL_TABLET | Freq: Two times a day (BID) | ORAL | Status: DC

## 2015-12-25 MED ORDER — ZOLPIDEM TARTRATE 10 MG PO TABS: 10.0000 mg | ORAL_TABLET | Freq: Every evening | ORAL | Status: DC | PRN

## 2015-12-26 NOTE — Telephone Encounter (Signed)
Alprazolam and Zolpidem called into [facility] [address]

## 2015-12-29 ENCOUNTER — Encounter: Payer: Self-pay | Admitting: Family Medicine

## 2016-02-09 ENCOUNTER — Other Ambulatory Visit: Payer: Self-pay | Admitting: Family Medicine

## 2016-02-09 NOTE — Telephone Encounter (Signed)
Zolpidem called into [facility] [address] 

## 2016-02-09 NOTE — Telephone Encounter (Signed)
Last office visit 09/22/2015 with Dr. Reece AgarG.  Last refilled 12/25/2015 for #30 with no refills.  Ok to refill?

## 2016-03-08 ENCOUNTER — Other Ambulatory Visit: Payer: Self-pay | Admitting: Family Medicine

## 2016-03-08 NOTE — Telephone Encounter (Signed)
Last office visit 10/22/2015 with Dr. Reece AgarG for URI. Last refilled.  Ok to refill?

## 2016-03-09 NOTE — Telephone Encounter (Signed)
Zolpidem & Alprazolam called into [facility] on [address]

## 2016-04-14 ENCOUNTER — Other Ambulatory Visit: Payer: Self-pay | Admitting: Family Medicine

## 2016-04-14 NOTE — Telephone Encounter (Signed)
Last office visit 10/22/2015 with Dr. Reece AgarG for URI.  Last refilled 03/09/2016 for #30 with no refills.  Ok to refill?

## 2016-04-15 NOTE — Telephone Encounter (Signed)
Zolpidem called into [facility] [address] 

## 2016-04-28 ENCOUNTER — Ambulatory Visit: Payer: Self-pay | Admitting: Physician Assistant

## 2016-04-28 ENCOUNTER — Encounter: Payer: Self-pay | Admitting: Physician Assistant

## 2016-04-28 VITALS — BP 130/79 | HR 83 | Temp 98.3°F

## 2016-04-28 DIAGNOSIS — W57XXXA Bitten or stung by nonvenomous insect and other nonvenomous arthropods, initial encounter: Secondary | ICD-10-CM

## 2016-04-28 DIAGNOSIS — R319 Hematuria, unspecified: Secondary | ICD-10-CM

## 2016-04-28 DIAGNOSIS — N390 Urinary tract infection, site not specified: Secondary | ICD-10-CM

## 2016-04-28 LAB — POCT URINALYSIS DIPSTICK
Bilirubin, UA: NEGATIVE
Glucose, UA: NEGATIVE
Ketones, UA: NEGATIVE
NITRITE UA: NEGATIVE
PH UA: 5.5
Spec Grav, UA: 1.015
UROBILINOGEN UA: 1

## 2016-04-28 MED ORDER — DOXYCYCLINE HYCLATE 100 MG PO TABS: 100.0000 mg | ORAL_TABLET | Freq: Two times a day (BID) | ORAL | Status: DC

## 2016-04-28 NOTE — Progress Notes (Signed)
S:  C/o uti sx for 2 days, burning, urgency, frequency, denies vaginal discharge, abdominal pain or flank pain: also had a tick bite about 2 months ago, still has rash and itching, tick had a spot on its back, no fever, chills;  Remainder ros neg  O:  Vitals wnl, nad, no cva tenderness, back nontender, lungs c t a,cv rrr, abd soft nontender, bs normal, skin with round inflamed area on lower back, n/v intact  A: uti, tick bite  P: doxy 100mg  bid x 10d, urine culture, increase water intake, add cranberry juice, return if not improving in 2 -3 days, return earlier if worsening, discussed pyelonephritis sx

## 2016-04-28 NOTE — Addendum Note (Signed)
Addended by: Faythe GheeFISHER, SUSAN W on: 04/28/2016 08:48 AM   Modules accepted: Orders

## 2016-04-28 NOTE — Addendum Note (Signed)
Addended by: Catha BrowEACON, MONIQUE T on: 04/28/2016 08:56 AM   Modules accepted: Orders

## 2016-04-30 LAB — URINE CULTURE

## 2016-05-05 ENCOUNTER — Other Ambulatory Visit: Payer: Self-pay | Admitting: Family Medicine

## 2016-05-05 NOTE — Telephone Encounter (Signed)
Last office visit 10/22/2015 with Dr. Reece AgarG.   Last refilled 03/09/2016 for #60 with no refills.   Ok to refill?

## 2016-05-06 ENCOUNTER — Other Ambulatory Visit: Payer: Self-pay | Admitting: Family Medicine

## 2016-05-06 NOTE — Telephone Encounter (Signed)
Phoned in to [facility].

## 2016-05-20 ENCOUNTER — Other Ambulatory Visit: Payer: Self-pay | Admitting: Family Medicine

## 2016-05-20 NOTE — Telephone Encounter (Signed)
Last office visit 10/22/2015 with Dr. Reece Agar.  Last refilled 04/15/2016 for #30 with no refills.  Ok to refill?

## 2016-05-20 NOTE — Telephone Encounter (Signed)
Zolpidem called into [facility] [address] 

## 2016-06-19 ENCOUNTER — Other Ambulatory Visit: Payer: Self-pay | Admitting: Family Medicine

## 2016-06-25 ENCOUNTER — Other Ambulatory Visit: Payer: Self-pay | Admitting: Family Medicine

## 2016-06-25 NOTE — Telephone Encounter (Signed)
Last office visit 10/22/2015 with Dr. Reece AgarG for URI.  Last refilled 05/20/2016 for #30 with no refills.  Ok to refill?

## 2016-06-25 NOTE — Telephone Encounter (Signed)
Zolpidem called into [facility] [address] 

## 2016-07-01 ENCOUNTER — Other Ambulatory Visit: Payer: Self-pay | Admitting: Family Medicine

## 2016-07-01 NOTE — Telephone Encounter (Signed)
Last office visit 07/18/2015.  Last refilled 05/24/2016 for #60 with no refills.

## 2016-07-01 NOTE — Telephone Encounter (Signed)
Alprazolam called into [facility] [address]

## 2016-07-19 ENCOUNTER — Telehealth: Payer: Self-pay | Admitting: Family Medicine

## 2016-07-19 ENCOUNTER — Other Ambulatory Visit: Payer: Self-pay | Admitting: Family Medicine

## 2016-07-19 NOTE — Telephone Encounter (Signed)
Left message asking pt to call office  °

## 2016-07-19 NOTE — Telephone Encounter (Signed)
Please call and schedule CPE with fasting labs prior for Dr. Bedsole.  

## 2016-07-20 ENCOUNTER — Telehealth: Payer: Self-pay | Admitting: Family Medicine

## 2016-07-20 DIAGNOSIS — Z8679 Personal history of other diseases of the circulatory system: Secondary | ICD-10-CM

## 2016-07-20 DIAGNOSIS — Z8639 Personal history of other endocrine, nutritional and metabolic disease: Secondary | ICD-10-CM

## 2016-07-20 NOTE — Telephone Encounter (Signed)
-----   Message from Alvina Chouerri J Walsh sent at 07/20/2016 11:21 AM EDT ----- Regarding: Lab orders for Wednesday, 9.27.17 Patient is scheduled for CPX labs, please order future labs, Thanks , Camelia Engerri

## 2016-07-21 ENCOUNTER — Other Ambulatory Visit (INDEPENDENT_AMBULATORY_CARE_PROVIDER_SITE_OTHER): Payer: Managed Care, Other (non HMO)

## 2016-07-21 DIAGNOSIS — Z8679 Personal history of other diseases of the circulatory system: Secondary | ICD-10-CM

## 2016-07-21 DIAGNOSIS — Z8639 Personal history of other endocrine, nutritional and metabolic disease: Secondary | ICD-10-CM | POA: Diagnosis not present

## 2016-07-21 NOTE — Telephone Encounter (Signed)
Labs 9/27 cpx 10/3

## 2016-07-22 LAB — COMPREHENSIVE METABOLIC PANEL
ALT: 12 U/L (ref 0–35)
AST: 17 U/L (ref 0–37)
Albumin: 3.9 g/dL (ref 3.5–5.2)
Alkaline Phosphatase: 62 U/L (ref 39–117)
BUN: 9 mg/dL (ref 6–23)
CHLORIDE: 101 meq/L (ref 96–112)
CO2: 30 meq/L (ref 19–32)
CREATININE: 0.7 mg/dL (ref 0.40–1.20)
Calcium: 8.9 mg/dL (ref 8.4–10.5)
GFR: 115.46 mL/min (ref >60.00)
GLUCOSE: 70 mg/dL (ref 70–99)
POTASSIUM: 3.5 meq/L (ref 3.5–5.1)
SODIUM: 139 meq/L (ref 135–145)
Total Bilirubin: 0.3 mg/dL (ref 0.2–1.2)
Total Protein: 7.7 g/dL (ref 6.0–8.3)

## 2016-07-22 LAB — LIPID PANEL
CHOL/HDL RATIO: 3
Cholesterol: 133 mg/dL (ref 0–200)
HDL: 48.3 mg/dL (ref >39.00)
LDL CALC: 64 mg/dL (ref 0–99)
NONHDL: 84.24
Triglycerides: 103 mg/dL (ref 0.0–149.0)
VLDL: 20.6 mg/dL (ref 0.0–40.0)

## 2016-07-22 LAB — HEMOGLOBIN A1C: HEMOGLOBIN A1C: 5.7 % (ref 4.6–6.5)

## 2016-07-27 ENCOUNTER — Ambulatory Visit (INDEPENDENT_AMBULATORY_CARE_PROVIDER_SITE_OTHER): Payer: Managed Care, Other (non HMO) | Admitting: Family Medicine

## 2016-07-27 ENCOUNTER — Encounter: Payer: Self-pay | Admitting: Family Medicine

## 2016-07-27 VITALS — BP 130/80 | HR 76 | Temp 98.4°F | Ht 67.25 in | Wt 229.2 lb

## 2016-07-27 DIAGNOSIS — Z8639 Personal history of other endocrine, nutritional and metabolic disease: Secondary | ICD-10-CM | POA: Diagnosis not present

## 2016-07-27 DIAGNOSIS — F411 Generalized anxiety disorder: Secondary | ICD-10-CM | POA: Diagnosis not present

## 2016-07-27 DIAGNOSIS — E7800 Pure hypercholesterolemia, unspecified: Secondary | ICD-10-CM

## 2016-07-27 DIAGNOSIS — J301 Allergic rhinitis due to pollen: Secondary | ICD-10-CM

## 2016-07-27 DIAGNOSIS — Z0000 Encounter for general adult medical examination without abnormal findings: Secondary | ICD-10-CM

## 2016-07-27 DIAGNOSIS — K644 Residual hemorrhoidal skin tags: Secondary | ICD-10-CM | POA: Insufficient documentation

## 2016-07-27 DIAGNOSIS — G4700 Insomnia, unspecified: Secondary | ICD-10-CM

## 2016-07-27 MED ORDER — HYDROCORTISONE ACE-PRAMOXINE 2.5-1 % RE CREA
1.0000 | TOPICAL_CREAM | Freq: Three times a day (TID) | RECTAL | 0 refills | Status: DC
Start: 2016-07-27 — End: 2016-09-13

## 2016-07-27 MED ORDER — FLUTICASONE PROPIONATE 50 MCG/ACT NA SUSP: NASAL | 11 refills | Status: DC

## 2016-07-27 MED ORDER — ZOLPIDEM TARTRATE 10 MG PO TABS: 10.0000 mg | ORAL_TABLET | Freq: Every evening | ORAL | 0 refills | Status: DC | PRN

## 2016-07-27 MED ORDER — ATORVASTATIN CALCIUM 40 MG PO TABS: 40.0000 mg | ORAL_TABLET | Freq: Every day | ORAL | 11 refills | Status: DC

## 2016-07-27 MED ORDER — SERTRALINE HCL 50 MG PO TABS: 50.0000 mg | ORAL_TABLET | Freq: Every day | ORAL | 5 refills | Status: DC

## 2016-07-27 NOTE — Progress Notes (Signed)
The patient is here for annual wellness exam and preventative care.   She has been having several days of pain with BMs and sitting, itchy.No blood in stool. Tried prep H.. No relief.  No constipation, BM every few days, no straining.  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 She previously had DM, HTN, high cholesterol. She is no longer on meds for these.  No longer has reflux after weight loss Wt Readings from Last 3 Encounters:  07/27/16 229 lb 4 oz (104 kg)  10/22/15 233 lb 12 oz (106 kg)  07/18/15 231 lb (104.8 kg)   Exercise: none Diet: poor Increase in stress with new job.  Body mass index is 35.64 kg/m.  BP Readings from Last 3 Encounters:  07/27/16 130/80  04/28/16 130/79  10/22/15 110/80    GAD, well controlled on 50 mg of sertraline, uses alprazolam 0.25 mg as needed not daily, using every other day.   Uses ambien for sleep. Insomnia improved some now working days.   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 Lab Results  Component Value Date   CHOL 133 07/22/2016   HDL 48.30 07/22/2016   LDLCALC 64 07/22/2016   LDLDIRECT 228.0 07/03/2013   TRIG 103.0 07/22/2016   CHOLHDL 3 07/22/2016   Hx of DM. Lab Results  Component Value Date   HGBA1C 5.7 07/22/2016    Social History /Family History/Past Medical History reviewed and updated if needed.     Review of Systems  Constitutional: Negative for fever, fatigue and unexpected weight change.  HENT: Negative for ear pain, congestion, sore throat, sneezing, trouble swallowing and sinus pressure.  Eyes: Negative for pain and itching.  Respiratory: Negative for cough, shortness of breath and wheezing.  Cardiovascular: Negative for chest pain, palpitations and leg swelling.  Gastrointestinal: Negative for nausea, abdominal pain, diarrhea, constipation and blood in stool.  Genitourinary:  Negative for dysuria, hematuria, vaginal discharge, difficulty urinating and menstrual problem.  Skin: Negative for rash.  Neurological: Negative for syncope, weakness, light-headedness, numbness and headaches.  Psychiatric/Behavioral: Negative for confusion and dysphoric mood. The patient is not nervous/anxious.       Objective:   Physical Exam  Constitutional: Vital signs are normal. She appears well-developed and well-nourished. She is cooperative. Non-toxic appearance. She does not appear ill. No distress.  HENT:  Head: Normocephalic.  Right Ear: Hearing, tympanic membrane, external ear and ear canal normal.  Left Ear: Hearing, tympanic membrane, external ear and ear canal normal.  Nose: Nose normal.  Eyes: Conjunctivae, EOM and lids are normal. Pupils are equal, round, and reactive to light. Lids are everted and swept, no foreign bodies found.  Neck: Trachea normal and normal range of motion. Neck supple. Carotid bruit is not present. No mass and no thyromegaly present.  Cardiovascular: Normal rate, regular rhythm, S1 normal, S2 normal, normal heart sounds and intact distal pulses. Exam reveals no gallop.  No murmur heard. Pulmonary/Chest: Effort normal and breath sounds normal. No respiratory distress. She has no wheezes. She has no rhonchi. She has no rales.  Abdominal: Soft. Normal appearance and bowel sounds are normal. She exhibits no distension, no fluid wave, no abdominal bruit and no mass. There is no hepatosplenomegaly. There is no tenderness. There is no rebound, no guarding and no CVA tenderness. No hernia.  Genitourinary: Vagina normal and uterus normal. No breast swelling, tenderness, discharge or bleeding.  Pelvic exam was performed with patient supine. There is no rash, tenderness or lesion on the right labia. There is no rash, tenderness or lesion on the left labia. Uterus is not enlarged and not tender. Right adnexum displays no mass, no tenderness and no  fullness. Left adnexum displays no mass, no tenderness and no fullness.  NO PAP RECTAL: grade 1 ext hemorrhoids, no thrombosis, no bleeding. Lymphadenopathy:   She has no cervical adenopathy.   She has no axillary adenopathy.  Neurological: She is alert. She has normal strength. No cranial nerve deficit or sensory deficit.  Skin: Skin is warm, dry and intact. No rash noted.  Psychiatric: Her speech is normal and behavior is normal. Judgment normal. Her mood appears not anxious. Cognition and memory are normal. She does not exhibit a depressed mood.     Assessment & Plan:  The patient's preventative maintenance and recommended screening tests for an annual wellness exam were reviewed in full today. Brought up to date unless services declined.  Counselled on the importance of diet, exercise, and its role in overall health and mortality. The patient's FH and SH was reviewed, including their home life, tobacco status, and drug and alcohol status.   Vaccines: Uptodate with Tdap. Gets flu at work PAP/DVE: 06/2013 nml, neg HPV on q5 years, due for yearly DVE. Nonsmoker, former 20 pack years! mammo: last nml 2013.Marland Kitchen Low risk.. Plan starting routine screeing at age 38  No early colon cancer in family.  Refused HIV screen

## 2016-07-27 NOTE — Assessment & Plan Note (Signed)
Irritated, no thrombosis. Treat with topical steroid cream.  Decrease obesity, increase water and fiber in diet.

## 2016-07-27 NOTE — Assessment & Plan Note (Signed)
Well controlled. Continue current medication.  

## 2016-07-27 NOTE — Assessment & Plan Note (Signed)
Stable A1C. Encouraged exercise, weight loss, healthy eating habits.

## 2016-07-27 NOTE — Progress Notes (Signed)
Pre visit review using our clinic review tool, if applicable. No additional management support is needed unless otherwise documented below in the visit note. 

## 2016-07-27 NOTE — Assessment & Plan Note (Signed)
Stable on flonase.

## 2016-07-27 NOTE — Assessment & Plan Note (Signed)
Stable on zoloft, try to reduce alprazolam use.

## 2016-07-27 NOTE — Patient Instructions (Addendum)
Work on low fat, low carb and weight loss.  Start regularly exercise.  Work on decreasing alprazolam as needed.  For hemorrhoids: treat with topical steroid/numbing cream prescription. Increase water and fiber in diet.

## 2016-07-27 NOTE — Assessment & Plan Note (Signed)
Refilled ambien prn.

## 2016-08-27 ENCOUNTER — Other Ambulatory Visit: Payer: Self-pay | Admitting: Family Medicine

## 2016-08-27 NOTE — Telephone Encounter (Signed)
Last office visit 07/27/2016.  Last refilled alprazolam 07/01/2016 and zolpidem 07/27/2016.  Ok to refill?

## 2016-08-27 NOTE — Telephone Encounter (Signed)
Al[facility] and [facility] [address] - [place] - [address]: [phone]

## 2016-09-13 ENCOUNTER — Encounter: Payer: Self-pay | Admitting: Physician Assistant

## 2016-09-13 ENCOUNTER — Ambulatory Visit: Payer: Self-pay | Admitting: Physician Assistant

## 2016-09-13 VITALS — BP 124/82 | HR 72 | Temp 98.6°F

## 2016-09-13 DIAGNOSIS — R300 Dysuria: Secondary | ICD-10-CM

## 2016-09-13 LAB — POCT URINALYSIS DIPSTICK
BILIRUBIN UA: NEGATIVE
Glucose, UA: NEGATIVE
KETONES UA: NEGATIVE
LEUKOCYTES UA: NEGATIVE
Nitrite, UA: NEGATIVE
PH UA: 5.5
Urobilinogen, UA: 0.2

## 2016-09-13 MED ORDER — SULFAMETHOXAZOLE-TRIMETHOPRIM 800-160 MG PO TABS: 1.0000 | ORAL_TABLET | Freq: Two times a day (BID) | ORAL | 0 refills | Status: DC

## 2016-09-13 MED ORDER — FLUCONAZOLE 150 MG PO TABS: ORAL_TABLET | ORAL | 0 refills | Status: DC

## 2016-09-13 NOTE — Progress Notes (Signed)
S:  C/o uti sx for 2 days, burning, urgency, frequency, denies vaginal discharge, abdominal pain or flank pain:  Remainder ros neg  O:  Vitals wnl, nad, ua with blood and protein  A: dysuria  P: urine culture, increase water intake, add cranberry juice, return if not improving in 2 -3 days, return earlier if worsening, discussed pyelonephritis sx, [facility], will notify pt of culture results

## 2016-09-15 LAB — URINE CULTURE

## 2016-09-27 ENCOUNTER — Other Ambulatory Visit: Payer: Self-pay | Admitting: Family Medicine

## 2016-09-27 NOTE — Telephone Encounter (Signed)
Last office visit 07/27/2016.  Last refilled 08/27/2016 for #30 with no refills.  Ok to refill?

## 2016-09-28 ENCOUNTER — Other Ambulatory Visit: Payer: Self-pay | Admitting: Family Medicine

## 2016-09-28 NOTE — Telephone Encounter (Signed)
[facility] [address] - [place]), [place] - [address] Phone: [phone]

## 2016-09-28 NOTE — Telephone Encounter (Signed)
Last filled 07-28-16 #30. Last OV 07-27-16 No Future OV

## 2016-09-28 NOTE — Telephone Encounter (Signed)
Left refill on voice mail at pharmacy  

## 2016-10-19 ENCOUNTER — Other Ambulatory Visit: Payer: Self-pay | Admitting: Family Medicine

## 2016-10-19 NOTE — Telephone Encounter (Addendum)
Ok to refill? Electronically refill request for   ALPRAZolam Prudy Feeler(XANAX) 0.25 MG tablet #60 with no refills  Last prescribed on 08/27/2016. Last seen on 07/27/2016

## 2016-10-22 ENCOUNTER — Other Ambulatory Visit: Payer: Self-pay | Admitting: Family Medicine

## 2016-10-22 NOTE — Telephone Encounter (Signed)
Left refill on voice mail at pharmacy  

## 2016-10-28 ENCOUNTER — Other Ambulatory Visit: Payer: Self-pay | Admitting: Family Medicine

## 2016-10-29 ENCOUNTER — Other Ambulatory Visit: Payer: Self-pay | Admitting: Family Medicine

## 2016-10-29 MED ORDER — ZOLPIDEM TARTRATE 10 MG PO TABS: 10.0000 mg | ORAL_TABLET | Freq: Every evening | ORAL | 0 refills | Status: DC | PRN

## 2016-10-29 NOTE — Telephone Encounter (Signed)
Last office visit 07/27/2016.  Last refilled 09/28/2016 for #30 with no refills.  Ok to refill?

## 2016-10-29 NOTE — Telephone Encounter (Signed)
Zolpidem called into [facility] - [place] ([place]), [place] - [address] Phone: [phone] 

## 2016-11-25 ENCOUNTER — Other Ambulatory Visit: Payer: Self-pay | Admitting: Family Medicine

## 2016-11-26 ENCOUNTER — Other Ambulatory Visit: Payer: Self-pay | Admitting: Family Medicine

## 2016-11-26 MED ORDER — ZOLPIDEM TARTRATE 10 MG PO TABS: 10.0000 mg | ORAL_TABLET | Freq: Every evening | ORAL | 0 refills | Status: DC | PRN

## 2016-11-26 NOTE — Telephone Encounter (Signed)
Last office visit 07/27/2016.  Last refilled 10/29/2016  #30 with no refills.  Ok to refill?

## 2016-11-26 NOTE — Telephone Encounter (Signed)
[facility] [address] - [place] ([place]), [place] - [address] Phone: [phone]

## 2016-11-27 ENCOUNTER — Other Ambulatory Visit: Payer: Self-pay | Admitting: Family Medicine

## 2016-12-15 ENCOUNTER — Other Ambulatory Visit: Payer: Self-pay | Admitting: Family Medicine

## 2016-12-16 MED ORDER — ALPRAZOLAM 0.25 MG PO TABS: 0.2500 mg | ORAL_TABLET | Freq: Two times a day (BID) | ORAL | 0 refills | Status: DC

## 2016-12-16 NOTE — Telephone Encounter (Signed)
Last office visit 07/27/16.  Last refilled 10/22/16 for #60 with no refills.  Ok to refill?

## 2016-12-16 NOTE — Telephone Encounter (Signed)
[facility] [address] - [place] ([place]), [place] - [address] Phone: [phone]

## 2016-12-27 ENCOUNTER — Other Ambulatory Visit: Payer: Self-pay | Admitting: Family Medicine

## 2016-12-28 MED ORDER — ZOLPIDEM TARTRATE 10 MG PO TABS: 10.0000 mg | ORAL_TABLET | Freq: Every evening | ORAL | 0 refills | Status: DC | PRN

## 2016-12-28 NOTE — Telephone Encounter (Signed)
[facility] [address] - [place]),  - [address] Phone: [phone]

## 2016-12-28 NOTE — Telephone Encounter (Signed)
Last office visit 07/27/2016.  Last refilled 11/26/2016 for #30 with no refills.  Ok to refill?

## 2017-01-27 ENCOUNTER — Other Ambulatory Visit: Payer: Self-pay | Admitting: Family Medicine

## 2017-01-27 MED ORDER — ZOLPIDEM TARTRATE 10 MG PO TABS: 10.0000 mg | ORAL_TABLET | Freq: Every evening | ORAL | 0 refills | Status: DC | PRN

## 2017-01-27 NOTE — Telephone Encounter (Signed)
Last office visit 07/27/2016.  Last refilled 12/28/2016 for #30 with no refills.  Ok to refill?

## 2017-01-27 NOTE — Telephone Encounter (Signed)
Zolpidem called into [facility] [address] ([address]), [place] - [address] Phone: [phone]

## 2017-02-14 ENCOUNTER — Other Ambulatory Visit: Payer: Self-pay | Admitting: Family Medicine

## 2017-02-15 ENCOUNTER — Other Ambulatory Visit: Payer: Self-pay | Admitting: Family Medicine

## 2017-02-15 MED ORDER — ALPRAZOLAM 0.25 MG PO TABS: 0.2500 mg | ORAL_TABLET | Freq: Two times a day (BID) | ORAL | 0 refills | Status: DC

## 2017-02-15 NOTE — Telephone Encounter (Signed)
Last office visit 07/27/2016.  Last refilled 12/16/2016 for #60 with no refills.  Ok to refill?

## 2017-02-15 NOTE — Telephone Encounter (Addendum)
Alprazolam called into [facility] [address] ([place]), [place] - [address] Phone: [phone]  Left message for Anicia to call the office and schedule a 6 month follow up per Dr. Ermalene Searing.

## 2017-02-15 NOTE — Telephone Encounter (Signed)
Recommend 6 month follow up on mood. Reiflled alprazolam.

## 2017-02-27 ENCOUNTER — Other Ambulatory Visit: Payer: Self-pay | Admitting: Family Medicine

## 2017-02-28 NOTE — Telephone Encounter (Signed)
Last office visit 07/27/2016.  Last refilled 01/27/2017 for #30 with no refills.  Ok to refill?

## 2017-03-01 ENCOUNTER — Other Ambulatory Visit: Payer: Self-pay | Admitting: Family Medicine

## 2017-03-01 MED ORDER — ZOLPIDEM TARTRATE 10 MG PO TABS: 10.0000 mg | ORAL_TABLET | Freq: Every evening | ORAL | 0 refills | Status: DC | PRN

## 2017-03-01 NOTE — Telephone Encounter (Signed)
[facility] [address] - [place] ([place]), [place] - [address] Phone: [phone]

## 2017-03-28 ENCOUNTER — Other Ambulatory Visit: Payer: Self-pay | Admitting: Family Medicine

## 2017-03-29 MED ORDER — ZOLPIDEM TARTRATE 10 MG PO TABS: 10.0000 mg | ORAL_TABLET | Freq: Every evening | ORAL | 0 refills | Status: DC | PRN

## 2017-03-29 NOTE — Telephone Encounter (Signed)
[facility] [address] - [place] ([place]), [place] - [address] Phone: [phone]

## 2017-03-29 NOTE — Telephone Encounter (Signed)
Last office visit 07/27/2016.  Last refilled 03/01/2017 for #30 with no refills.  Ok to refill?

## 2017-04-01 ENCOUNTER — Encounter: Payer: Self-pay | Admitting: Family Medicine

## 2017-04-01 ENCOUNTER — Other Ambulatory Visit: Payer: Self-pay | Admitting: *Deleted

## 2017-04-01 ENCOUNTER — Ambulatory Visit (INDEPENDENT_AMBULATORY_CARE_PROVIDER_SITE_OTHER): Payer: Managed Care, Other (non HMO) | Admitting: Family Medicine

## 2017-04-01 ENCOUNTER — Encounter: Payer: Self-pay | Admitting: Radiology

## 2017-04-01 VITALS — BP 110/80 | HR 80 | Temp 98.4°F | Ht 67.25 in | Wt 230.2 lb

## 2017-04-01 DIAGNOSIS — F411 Generalized anxiety disorder: Secondary | ICD-10-CM | POA: Diagnosis not present

## 2017-04-01 DIAGNOSIS — F5104 Psychophysiologic insomnia: Secondary | ICD-10-CM | POA: Diagnosis not present

## 2017-04-01 MED ORDER — SERTRALINE HCL 100 MG PO TABS: 50.0000 mg | ORAL_TABLET | Freq: Every day | ORAL | 11 refills | Status: DC

## 2017-04-01 NOTE — Progress Notes (Signed)
   Subjective:    Patient ID: Laura Gordon, female    DOB: [birth year], 46 y.o.   MRN: [id number]  HPI   46 year old female presents for follow up mood.   GAD,  On zoloft 50 mg , using  0.25 mg alprazolam every other day.Marland Kitchen. No panic attacks.. Feels more generalized anxiety.  She has been on alprazolam for 15 years... Started after Mom died.  Not under a lot of stress.  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   Chronic insomnia: using Ambien at bedtime every night.. She has tried not using it but cannot sleep.  PHQ9: 2 today GAD7:10    BP Readings from Last 3 Encounters:  04/01/17 110/80  09/13/16 124/82  07/27/16 130/80     Review of Systems  Constitutional: Negative for fatigue and fever.  HENT: Negative for ear pain.   Eyes: Negative for pain.  Respiratory: Negative for chest tightness and shortness of breath.   Cardiovascular: Negative for chest pain, palpitations and leg swelling.  Gastrointestinal: Negative for abdominal pain.  Genitourinary: Negative for dysuria.       Objective:   Physical Exam  Constitutional: Vital signs are normal. She appears well-developed and well-nourished. She is cooperative.  Non-toxic appearance. She does not appear ill. No distress.  HENT:  Head: Normocephalic.  Right Ear: Hearing, tympanic membrane, external ear and ear canal normal. Tympanic membrane is not erythematous, not retracted and not bulging.  Left Ear: Hearing, tympanic membrane, external ear and ear canal normal. Tympanic membrane is not erythematous, not retracted and not bulging.  Nose: No mucosal edema or rhinorrhea. Right sinus exhibits no maxillary sinus tenderness and no frontal sinus tenderness. Left sinus exhibits no maxillary sinus tenderness and no frontal sinus tenderness.  Mouth/Throat: Uvula is midline, oropharynx is clear and moist and mucous membranes are normal.  Eyes: Conjunctivae, EOM and lids are normal. Pupils are equal, round, and reactive to  light. Lids are everted and swept, no foreign bodies found.  Neck: Trachea normal and normal range of motion. Neck supple. Carotid bruit is not present. No thyroid mass and no thyromegaly present.  Cardiovascular: Normal rate, regular rhythm, S1 normal, S2 normal, normal heart sounds, intact distal pulses and normal pulses.  Exam reveals no gallop and no friction rub.   No murmur heard. Pulmonary/Chest: Effort normal and breath sounds normal. No tachypnea. No respiratory distress. She has no decreased breath sounds. She has no wheezes. She has no rhonchi. She has no rales.  Abdominal: Soft. Normal appearance and bowel sounds are normal. There is no tenderness.  Neurological: She is alert.  Skin: Skin is warm, dry and intact. No rash noted.  Psychiatric: Her speech is normal and behavior is normal. Judgment and thought content normal. Her mood appears not anxious. Cognition and memory are normal. She does not exhibit a depressed mood.          Assessment & Plan:

## 2017-04-01 NOTE — Assessment & Plan Note (Addendum)
Inadequate control.. Will try higher dose of sertraline to try to decrease use of alprazolam.  Hopefully it will also help with sleep.  She will look into  Counseling, consider. Follow up in 4 weeks.

## 2017-04-01 NOTE — Patient Instructions (Addendum)
Increase sertraline to  100 mg at bedtime. Once able .. decrease alprazolam to 1/2 tablet every other day. Please stop at the lab to  Have UDs and CS form completed.

## 2017-04-01 NOTE — Assessment & Plan Note (Signed)
Using Ambien nightly. Counseled on healthy sleep hygiene.

## 2017-04-04 ENCOUNTER — Encounter: Payer: Self-pay | Admitting: Family Medicine

## 2017-04-13 ENCOUNTER — Other Ambulatory Visit: Payer: Self-pay | Admitting: Family Medicine

## 2017-04-14 NOTE — Telephone Encounter (Signed)
Ok, 60, 0 ref 

## 2017-04-14 NOTE — Telephone Encounter (Signed)
[facility] [address] - [place] ([place]), [place] - [address] Phone: [phone]

## 2017-04-14 NOTE — Telephone Encounter (Signed)
Last office visit 04/01/2017.  Last refilled 02/15/2017 for #60 with no refills.  Ok to refill?

## 2017-04-24 ENCOUNTER — Encounter: Payer: Self-pay | Admitting: Family Medicine

## 2017-04-24 MED ORDER — SERTRALINE HCL 100 MG PO TABS: 100.0000 mg | ORAL_TABLET | Freq: Every day | ORAL | 3 refills | Status: DC

## 2017-04-25 ENCOUNTER — Encounter: Payer: Self-pay | Admitting: Family Medicine

## 2017-04-27 ENCOUNTER — Other Ambulatory Visit: Payer: Self-pay | Admitting: Family Medicine

## 2017-04-28 ENCOUNTER — Other Ambulatory Visit: Payer: Self-pay | Admitting: Family Medicine

## 2017-04-28 NOTE — Telephone Encounter (Signed)
Last office visit 04/01/2017.  Last refilled 03/29/17 for #30 with no refills.  Ok to refill?

## 2017-04-29 MED ORDER — ZOLPIDEM TARTRATE 10 MG PO TABS: 10.0000 mg | ORAL_TABLET | Freq: Every evening | ORAL | 0 refills | Status: DC | PRN

## 2017-04-29 NOTE — Telephone Encounter (Signed)
[facility]em [facility] into [facility] [address] - [place] ([place]), [place] - [address] Phone: [phone]

## 2017-05-30 ENCOUNTER — Other Ambulatory Visit: Payer: Self-pay | Admitting: Family Medicine

## 2017-05-31 ENCOUNTER — Other Ambulatory Visit: Payer: Self-pay | Admitting: Family Medicine

## 2017-05-31 MED ORDER — ZOLPIDEM TARTRATE 10 MG PO TABS: 10.0000 mg | ORAL_TABLET | Freq: Every evening | ORAL | 1 refills | Status: DC | PRN

## 2017-05-31 MED ORDER — ALPRAZOLAM 0.25 MG PO TABS: 0.2500 mg | ORAL_TABLET | Freq: Two times a day (BID) | ORAL | 1 refills | Status: DC

## 2017-05-31 NOTE — Telephone Encounter (Signed)
Last office visit 04/01/2017.  Last refilled Alprazolam 04/14/2017 for #60 with no refills. Zolpidem 04/29/2017 for #30 with no refills.  Ok to refill?

## 2017-05-31 NOTE — Telephone Encounter (Signed)
Alpraz[facility]am and [facility]pidem [facility] [address] - [place] ([place]), [place] - [address] Phone: [phone]

## 2017-08-08 ENCOUNTER — Other Ambulatory Visit: Payer: Self-pay | Admitting: Family Medicine

## 2017-08-08 NOTE — Telephone Encounter (Signed)
Last office visit 04/01/2017.  Last refilled 05/31/2017 for #30 with 1 refill.  Ok to refill?

## 2017-08-09 NOTE — Telephone Encounter (Signed)
Zolpidem called into [facility] [address] ([address]), [place] - [address] Phone: [phone]

## 2017-08-16 ENCOUNTER — Other Ambulatory Visit: Payer: Self-pay | Admitting: Physician Assistant

## 2017-08-17 NOTE — Telephone Encounter (Signed)
spreadsheet

## 2017-08-18 ENCOUNTER — Encounter: Payer: Self-pay | Admitting: Family Medicine

## 2017-08-19 MED ORDER — ATORVASTATIN CALCIUM 40 MG PO TABS: 40.0000 mg | ORAL_TABLET | Freq: Every day | ORAL | 0 refills | Status: DC

## 2017-08-19 MED ORDER — FLUTICASONE PROPIONATE 50 MCG/ACT NA SUSP: NASAL | 0 refills | Status: DC

## 2017-08-22 ENCOUNTER — Telehealth: Payer: Self-pay | Admitting: Family Medicine

## 2017-08-22 MED ORDER — FLUTICASONE PROPIONATE 50 MCG/ACT NA SUSP: NASAL | 0 refills | Status: DC

## 2017-08-22 MED ORDER — ATORVASTATIN CALCIUM 40 MG PO TABS: 40.0000 mg | ORAL_TABLET | Freq: Every day | ORAL | 0 refills | Status: DC

## 2017-08-22 NOTE — Telephone Encounter (Signed)
Copied from CRM [id number]. Topic: Quick Communication - See Telephone Encounter >> Aug 22, 2017  2:43 PM Anice PaganiniMunoz, Britania Shreeve I, NT wrote: CRM for notification. See Telephone encounter for:  08/22/17.flonase

## 2017-08-22 NOTE — Telephone Encounter (Signed)
This has been taken care of.  Refills were sent.

## 2017-09-06 ENCOUNTER — Other Ambulatory Visit: Payer: Self-pay

## 2017-09-06 ENCOUNTER — Encounter: Payer: Self-pay | Admitting: Family Medicine

## 2017-09-06 ENCOUNTER — Ambulatory Visit (INDEPENDENT_AMBULATORY_CARE_PROVIDER_SITE_OTHER): Payer: Managed Care, Other (non HMO) | Admitting: Family Medicine

## 2017-09-06 VITALS — BP 130/86 | HR 80 | Temp 98.7°F | Ht 67.5 in | Wt 219.5 lb

## 2017-09-06 DIAGNOSIS — F411 Generalized anxiety disorder: Secondary | ICD-10-CM

## 2017-09-06 DIAGNOSIS — F5104 Psychophysiologic insomnia: Secondary | ICD-10-CM | POA: Diagnosis not present

## 2017-09-06 DIAGNOSIS — Z8673 Personal history of transient ischemic attack (TIA), and cerebral infarction without residual deficits: Secondary | ICD-10-CM | POA: Diagnosis not present

## 2017-09-06 DIAGNOSIS — Z6833 Body mass index (BMI) 33.0-33.9, adult: Secondary | ICD-10-CM | POA: Insufficient documentation

## 2017-09-06 DIAGNOSIS — R6884 Jaw pain: Secondary | ICD-10-CM | POA: Diagnosis not present

## 2017-09-06 DIAGNOSIS — E7800 Pure hypercholesterolemia, unspecified: Secondary | ICD-10-CM | POA: Diagnosis not present

## 2017-09-06 DIAGNOSIS — Z0000 Encounter for general adult medical examination without abnormal findings: Secondary | ICD-10-CM | POA: Diagnosis not present

## 2017-09-06 DIAGNOSIS — E669 Obesity, unspecified: Secondary | ICD-10-CM | POA: Insufficient documentation

## 2017-09-06 DIAGNOSIS — E6609 Other obesity due to excess calories: Secondary | ICD-10-CM

## 2017-09-06 DIAGNOSIS — Z8639 Personal history of other endocrine, nutritional and metabolic disease: Secondary | ICD-10-CM

## 2017-09-06 MED ORDER — FLUTICASONE PROPIONATE 50 MCG/ACT NA SUSP: NASAL | 11 refills | Status: AC

## 2017-09-06 MED ORDER — SERTRALINE HCL 100 MG PO TABS: 100.0000 mg | ORAL_TABLET | Freq: Every day | ORAL | 11 refills | Status: DC

## 2017-09-06 MED ORDER — ATORVASTATIN CALCIUM 40 MG PO TABS: 40.0000 mg | ORAL_TABLET | Freq: Every day | ORAL | 11 refills | Status: DC

## 2017-09-06 NOTE — Patient Instructions (Addendum)
Please stop at the lab to have labs drawn.  Work on try to get  30 min 3-5 times a week.  Short term course of aleve for jaw pain and inflammation.  Stop if stomach irritation, take on full stomach and can take prilosec with it.  We can always try meloxicam if stomach is irritated.  Ice  On jaw, and avoid talking , chewing tough things... Soft diet.  Call if mood is not improving with time.

## 2017-09-06 NOTE — Assessment & Plan Note (Signed)
Most likely clenching teeth.. Causing muscle ache in jaw. Not clearly TMJ. Recommend  NSaid short course, ice and jaw rest.

## 2017-09-06 NOTE — Assessment & Plan Note (Signed)
Moderate control on sertraline 100 mg daily. Using low dose alprazolam daily. Encouraged her toi limit. Offered counseling.  Follow up in 6 months.

## 2017-09-06 NOTE — Assessment & Plan Note (Signed)
Encouraged exercise, weight loss, healthy eating habits. ? ?

## 2017-09-06 NOTE — Assessment & Plan Note (Signed)
Stable on ambien.. Pt unable to wean off.

## 2017-09-06 NOTE — Progress Notes (Signed)
Subjective:    Patient ID: Fredric MareCassandra P Trias, female    DOB: 1971-09-05, 46 y.o.   MRN: [id number]  HPI   46 year old female patient is here for annual wellness exam and preventative care.   Elevated Cholesterol:  Hx of TIA, due for re-eval. Lab Results  Component Value Date   CHOL 133 07/22/2016   HDL 48.30 07/22/2016   LDLCALC 64 07/22/2016   LDLDIRECT 228.0 07/03/2013   TRIG 103.0 07/22/2016   CHOLHDL 3 07/22/2016   Hx of DM.Marland Kitchen.  Due for re-eval. Lab Results  Component Value Date   HGBA1C 5.7 07/22/2016  Using medications without problems: none Muscle aches: none Diet compliance: decreased portion size Exercise: none Other complaints:  Right jaw pain x 3 months. Worse with talking and eating.  no gum use.. Dentist said no tooth issue. No popping.  Occ right eye twitching. NSAIDs contraindicated.. With history of gastric sleeve.  X-rays clear.  GAD, moderate control on 100 mg of sertraline, Son with suicide attempt  And this shook her up. uses alprazolam 0.25 mg as needed daily  Uses ambien for sleep. Insomnia improved some now working days.   GAD7 9  Social History /Family History/Past Medical History reviewed in detail and updated in EMR if needed. Blood pressure 130/86, pulse 80, temperature 98.7 F (37.1 C), temperature source Oral, height 5' 7.5" (1.715 m), weight 219 lb 8 oz (99.6 kg). Body mass index is 33.87 kg/m. Wt Readings from Last 3 Encounters:  09/06/17 219 lb 8 oz (99.6 kg)  04/01/17 230 lb 4 oz (104.4 kg)  07/27/16 229 lb 4 oz (104 kg)     Review of Systems  Constitutional: Negative for fatigue and fever.  HENT: Negative for congestion.   Eyes: Negative for pain.  Respiratory: Negative for cough and shortness of breath.   Cardiovascular: Negative for chest pain, palpitations and leg swelling.  Gastrointestinal: Negative for abdominal pain.  Genitourinary: Negative for dysuria and vaginal bleeding.  Musculoskeletal: Negative for back pain.    Neurological: Negative for syncope, light-headedness and headaches.  Psychiatric/Behavioral: Negative for dysphoric mood.       Objective:   Physical Exam  Constitutional: Vital signs are normal. She appears well-developed and well-nourished. She is cooperative.  Non-toxic appearance. She does not appear ill. No distress.  HENT:  Head: Normocephalic.  Right Ear: Hearing, tympanic membrane, external ear and ear canal normal.  Left Ear: Hearing, tympanic membrane, external ear and ear canal normal.  Nose: Nose normal.  ttp over right masseter muscle, no popping or focal pain in right TMJ  Eyes: Conjunctivae, EOM and lids are normal. Pupils are equal, round, and reactive to light. Lids are everted and swept, no foreign bodies found.  Neck: Trachea normal and normal range of motion. Neck supple. Carotid bruit is not present. No thyroid mass and no thyromegaly present.  Cardiovascular: Normal rate, regular rhythm, S1 normal, S2 normal, normal heart sounds and intact distal pulses. Exam reveals no gallop.  No murmur heard. Pulmonary/Chest: Effort normal and breath sounds normal. No respiratory distress. She has no wheezes. She has no rhonchi. She has no rales.  Abdominal: Soft. Normal appearance and bowel sounds are normal. She exhibits no distension, no fluid wave, no abdominal bruit and no mass. There is no hepatosplenomegaly. There is no tenderness. There is no rebound, no guarding and no CVA tenderness. No hernia.  Lymphadenopathy:    She has no cervical adenopathy.    She has no axillary adenopathy.  Neurological: She is alert. She has normal strength. No cranial nerve deficit or sensory deficit.  Skin: Skin is warm, dry and intact. No rash noted.  Psychiatric: Her speech is normal and behavior is normal. Judgment normal. Her mood appears not anxious. Cognition and memory are normal. She does not exhibit a depressed mood.          Assessment & Plan:  The patient's preventative  maintenance and recommended screening tests for an annual wellness exam were reviewed in full today. Brought up to date unless services declined.  Counselled on the importance of diet, exercise, and its role in overall health and mortality. The patient's FH and SH was reviewed, including their home life, tobacco status, and drug and alcohol status.   Vaccines: Uptodate with Tdap and flu. PAP/DVE: 06/2013 nml, neg HPV, on q5 years  former 20 pack years! Stopped in 2014. No daily cough mammo: last nml 2013.Marland Kitchen. Low risk.. Plan starting routine screeing at age 89  No early colon cancer in family.  Refused HIV screen.

## 2017-09-07 LAB — LIPID PANEL
CHOL/HDL RATIO: 3
Cholesterol: 137 mg/dL (ref 0–200)
HDL: 49.6 mg/dL (ref >39.00)
LDL CALC: 66 mg/dL (ref 0–99)
NONHDL: 87.89
Triglycerides: 107 mg/dL (ref 0.0–149.0)
VLDL: 21.4 mg/dL (ref 0.0–40.0)

## 2017-09-07 LAB — COMPREHENSIVE METABOLIC PANEL
ALBUMIN: 4.1 g/dL (ref 3.5–5.2)
ALT: 12 U/L (ref 0–35)
AST: 17 U/L (ref 0–37)
Alkaline Phosphatase: 46 U/L (ref 39–117)
BUN: 8 mg/dL (ref 6–23)
CALCIUM: 9.3 mg/dL (ref 8.4–10.5)
CHLORIDE: 102 meq/L (ref 96–112)
CO2: 33 meq/L — AB (ref 19–32)
CREATININE: 0.74 mg/dL (ref 0.40–1.20)
GFR: 107.77 mL/min (ref >60.00)
Glucose, Bld: 86 mg/dL (ref 70–99)
POTASSIUM: 3.5 meq/L (ref 3.5–5.1)
Sodium: 141 mEq/L (ref 135–145)
Total Bilirubin: 0.5 mg/dL (ref 0.2–1.2)
Total Protein: 7.4 g/dL (ref 6.0–8.3)

## 2017-09-07 LAB — HEMOGLOBIN A1C: Hgb A1c MFr Bld: 5.6 % (ref 4.6–6.5)

## 2017-09-18 ENCOUNTER — Other Ambulatory Visit: Payer: Self-pay | Admitting: Family Medicine

## 2017-09-19 NOTE — Telephone Encounter (Signed)
Alprazolam called into [facility] - [place] ([place]), [place] - [address]

## 2017-09-19 NOTE — Telephone Encounter (Signed)
Last office visit 09/06/2017.  Last refilled 05/31/2017 for #60 with 1 refill.  Ok to refill?

## 2017-10-10 ENCOUNTER — Other Ambulatory Visit: Payer: Self-pay | Admitting: Family Medicine

## 2017-10-11 NOTE — Telephone Encounter (Signed)
Last office visit 09/06/2017.  Last refilled 08/09/2017 for #30 with 1 refill.  Ok to refill?

## 2017-10-11 NOTE — Telephone Encounter (Signed)
Zolpidem called into [facility] - [place] ([place]), [place] - [address]

## 2017-10-13 ENCOUNTER — Ambulatory Visit: Payer: Self-pay

## 2017-11-13 ENCOUNTER — Other Ambulatory Visit: Payer: Self-pay | Admitting: Family Medicine

## 2017-11-14 ENCOUNTER — Encounter: Payer: Self-pay | Admitting: Family Medicine

## 2017-11-14 NOTE — Telephone Encounter (Signed)
Last office visit 09/06/2017.  Last refilled 10/11/2017 for #30 with 1 refill.  Ok to refill?

## 2017-11-29 ENCOUNTER — Ambulatory Visit: Payer: Self-pay | Admitting: Family Medicine

## 2017-11-29 VITALS — BP 120/80 | HR 123 | Temp 98.7°F | Resp 16

## 2017-11-29 DIAGNOSIS — Z20828 Contact with and (suspected) exposure to other viral communicable diseases: Secondary | ICD-10-CM

## 2017-11-29 DIAGNOSIS — R3129 Other microscopic hematuria: Secondary | ICD-10-CM

## 2017-11-29 DIAGNOSIS — R6889 Other general symptoms and signs: Secondary | ICD-10-CM

## 2017-11-29 DIAGNOSIS — J069 Acute upper respiratory infection, unspecified: Secondary | ICD-10-CM

## 2017-11-29 DIAGNOSIS — R309 Painful micturition, unspecified: Secondary | ICD-10-CM

## 2017-11-29 LAB — POCT URINALYSIS DIPSTICK
BILIRUBIN UA: NEGATIVE
Glucose, UA: NEGATIVE
Ketones, UA: NEGATIVE
Leukocytes, UA: NEGATIVE
NITRITE UA: NEGATIVE
PH UA: 5 (ref 5.0–8.0)
Spec Grav, UA: <=1.005 — AB (ref 1.010–1.025)
UROBILINOGEN UA: 0.2 U/dL

## 2017-11-29 LAB — POCT INFLUENZA A/B
INFLUENZA B, POC: NEGATIVE
Influenza A, POC: NEGATIVE

## 2017-11-29 NOTE — Patient Instructions (Addendum)
Treat yourself symptomatically.  Drink plenty of fluids and get enough rest  Take Tylenol or ibuprofen for aching or fever  Take over-the-counter Robitussin-DM or Mucinex DM if needed for bad cough  Can take plain Mucinex as needed to thin the secretions.  Take Claritin or Claritin-D or Allegra or Allegra daily if needed for head congestion.  If you are getting worse rather than better get rechecked  Drink lots of fluids to continue to flush out your bladder.  When you see your family physician next time get a urine dip done to see if you persist in having trace amounts of blood in your urine.  Plan to stay off work through tomorrow.

## 2017-11-29 NOTE — Progress Notes (Signed)
Patient ID: Laura Gordon, female    DOB: [birth year]  Age: 47 y.o. MRN: [id number]  Chief Complaint  Patient presents with  . URI  . Urinary Tract Infection    Subjective:   Patient has been having some mild dysuria week.  She has been having a long history of UTIs and wanted to get checked for that.  She has had flulike symptoms for the last couple of days with head congestion low-grade temperature up to 100.1, some body aches and cough.  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  Her daughter and grandchildren have had influenza.  Her grandchild had RSV.  She had a flu shot and she is generally  current allergies, medications, problem list, past/family and social histories reviewed.  Objective:  BP 120/80   Pulse (!) 123   Temp 98.7 F (37.1 C) (Oral)   Resp 16   SpO2 96%   Overweight pleasant alert oriented lady in no major distress.  Her TMs are normal.  Throat not erythematous.  Neck supple without significant nodes.  Chest is clear to auscultation.  Heart regular without murmur.  No CVA tenderness.  Urinalysis was negative except for a 1+ blood on the dip.  Flu test negative  Assessment & Plan:   Assessment: 1. Exposure to the flu   2. Urination pain   3. Flu-like symptoms   4. Acute upper respiratory infection   5. Microscopic hematuria       Plan: See instructions.  Orders Placed This Encounter  Procedures  . POCT Urinalysis Dipstick (CPT 81002)  . POCT Influenza A/B (POC66)    No orders of the defined types were placed in this encounter.        Patient Instructions  Treat yourself symptomatically.  Drink plenty of fluids and get enough rest  Take Tylenol or ibuprofen for aching or fever  Take over-the-counter Robitussin-DM or Mucinex DM if needed for bad cough  Can take plain Mucinex as needed to thin the secretions.  Take Claritin or Claritin-D or Allegra or Allegra daily if  needed for head congestion.  If you are getting worse rather than better get rechecked  Drink lots of fluids to continue to flush out your bladder.  When you see your family physician next time get a urine dip done to see if you persist in having trace amounts of blood in your urine.  Plan to stay off work through tomorrow.    No Follow-up on file.   HOPPER,DAVID, MD 11/29/2017

## 2017-12-01 ENCOUNTER — Ambulatory Visit: Payer: Self-pay | Admitting: Medical

## 2017-12-01 DIAGNOSIS — J209 Acute bronchitis, unspecified: Secondary | ICD-10-CM

## 2017-12-01 DIAGNOSIS — R059 Cough, unspecified: Secondary | ICD-10-CM

## 2017-12-01 DIAGNOSIS — R05 Cough: Secondary | ICD-10-CM

## 2017-12-01 MED ORDER — AZITHROMYCIN 250 MG PO TABS: ORAL_TABLET | ORAL | 0 refills | Status: DC

## 2017-12-01 MED ORDER — BENZONATATE 100 MG PO CAPS: 100.0000 mg | ORAL_CAPSULE | Freq: Three times a day (TID) | ORAL | 0 refills | Status: DC | PRN

## 2017-12-01 NOTE — Progress Notes (Signed)
   Subjective:    Patient ID: Fredric MareCassandra P Covelli, female    DOB: 07-27-1971, 47 y.o.   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 Started on Friday or Sunday with runny nose clear,  Body aches, fever 102.3 and sore throat Cough now productive yellow. Seen in clinic on 11/29/17 By Dr. Alwyn RenHopper, DX : upper respiratory infection. placed on symptomatic treatment.  She feels she is worse and her cough is worse.Presents with husband today. Ibuprofen 800 mg today at noon. Alternating Ibuprofen and Tylenol.   Review of Systems  Constitutional: Positive for chills, fatigue and fever.  HENT: Positive for congestion and sore throat. Negative for ear discharge.   Respiratory: Positive for cough (productive yellow) and shortness of breath (with coughing).        Objective:   Physical Exam  Constitutional: She appears well-developed and well-nourished.  HENT:  Head: Normocephalic and atraumatic.  Right Ear: Hearing, external ear and ear canal normal. A middle ear effusion is present.  Left Ear: Hearing, external ear and ear canal normal. Tympanic membrane is erythematous.  Nose: Mucosal edema and rhinorrhea present.  Mouth/Throat: Uvula is midline and mucous membranes are normal. Posterior oropharyngeal erythema present.  Nursing note and vitals reviewed.   obese   cough noted in room. Assessment & Plan:  Bronchitis , cough otitis media left Meds ordered this encounter  Medications  . azithromycin (ZITHROMAX) 250 MG tablet    Sig: Take 2 tablets by mouth day 1 then one tablet days  2-5 , take with food    Dispense:  6 tablet    Refill:  0  . benzonatate (TESSALON) 100 MG capsule    Sig: Take 1 capsule (100 mg total) by mouth 3 (three) times daily as needed for cough.    Dispense:  30 capsule    Refill:  0  Return in  3-5 days if not improving. Work note for today and yesterday. Patient verbalizes understanding and has no questions at  discharge.

## 2017-12-13 ENCOUNTER — Other Ambulatory Visit: Payer: Self-pay | Admitting: Family Medicine

## 2017-12-14 NOTE — Telephone Encounter (Signed)
Last office visit 09/06/2017.  Last refilled 11/14/2017 for #30 with no refills.  Ok to refill?

## 2017-12-30 ENCOUNTER — Other Ambulatory Visit: Payer: Self-pay | Admitting: Family Medicine

## 2018-01-02 NOTE — Telephone Encounter (Signed)
Last office visit 09/06/2017.  Last refilled 09/19/2017 for #60 with 1 refills.  Ok to refill?

## 2018-01-11 ENCOUNTER — Other Ambulatory Visit: Payer: Self-pay | Admitting: Family Medicine

## 2018-01-12 ENCOUNTER — Other Ambulatory Visit: Payer: Self-pay | Admitting: Family Medicine

## 2018-01-12 NOTE — Telephone Encounter (Signed)
Zolpidem called into [facility] - [place] ([address]), [place] - [address] 

## 2018-01-12 NOTE — Telephone Encounter (Signed)
Last office visit 09/06/2017.  Last refilled 12/14/2017 for #30 with no refills.  Ok to refill?

## 2018-02-13 ENCOUNTER — Other Ambulatory Visit: Payer: Self-pay | Admitting: Family Medicine

## 2018-02-14 NOTE — Telephone Encounter (Signed)
Last office visit 09/06/2017.  Last refilled 01/12/2018 for #30 with no refills.  Ok to refill?

## 2018-03-14 ENCOUNTER — Other Ambulatory Visit: Payer: Self-pay | Admitting: Family Medicine

## 2018-03-14 ENCOUNTER — Ambulatory Visit: Payer: Managed Care, Other (non HMO) | Admitting: Internal Medicine

## 2018-03-15 ENCOUNTER — Ambulatory Visit: Payer: Self-pay | Admitting: Internal Medicine

## 2018-03-15 NOTE — Telephone Encounter (Signed)
Last office visit 09/06/2017.  Last refilled 02/14/2018 for #30 with no refills.  Ok to refill?

## 2018-03-20 ENCOUNTER — Other Ambulatory Visit: Payer: Self-pay | Admitting: Family Medicine

## 2018-03-21 MED ORDER — ALPRAZOLAM 0.25 MG PO TABS: 0.2500 mg | ORAL_TABLET | Freq: Two times a day (BID) | ORAL | 0 refills | Status: DC

## 2018-03-21 NOTE — Telephone Encounter (Signed)
Last office visit 09/06/2017.  Last refilled 01/03/2018 for #60 with 1 refill.  Ok to refill?

## 2018-03-21 NOTE — Telephone Encounter (Signed)
Appointment scheduled for 03/24/2018.  Ok to refill?

## 2018-03-21 NOTE — Telephone Encounter (Signed)
Per last OV.Marland Kitchen She was to have 6 month follow up GAD. Call to schedule.. Once made okay to refill until appt. Will deny rx until then.

## 2018-03-21 NOTE — Addendum Note (Signed)
Addended by: Damita Lack on: 03/21/2018 10:16 AM   Modules accepted: Orders

## 2018-03-24 ENCOUNTER — Ambulatory Visit: Payer: Managed Care, Other (non HMO) | Admitting: Family Medicine

## 2018-03-24 ENCOUNTER — Encounter: Payer: Self-pay | Admitting: Family Medicine

## 2018-03-24 ENCOUNTER — Other Ambulatory Visit: Payer: Self-pay | Admitting: *Deleted

## 2018-03-24 ENCOUNTER — Other Ambulatory Visit: Payer: Self-pay

## 2018-03-24 VITALS — BP 110/74 | HR 75 | Temp 98.7°F | Ht 67.5 in | Wt 222.8 lb

## 2018-03-24 DIAGNOSIS — B373 Candidiasis of vulva and vagina: Secondary | ICD-10-CM | POA: Diagnosis not present

## 2018-03-24 DIAGNOSIS — F411 Generalized anxiety disorder: Secondary | ICD-10-CM | POA: Diagnosis not present

## 2018-03-24 DIAGNOSIS — B3731 Acute candidiasis of vulva and vagina: Secondary | ICD-10-CM | POA: Insufficient documentation

## 2018-03-24 MED ORDER — SERTRALINE HCL 100 MG PO TABS: 150.0000 mg | ORAL_TABLET | Freq: Every day | ORAL | 5 refills | Status: DC

## 2018-03-24 MED ORDER — FLUCONAZOLE 150 MG PO TABS: 150.0000 mg | ORAL_TABLET | Freq: Once | ORAL | 0 refills | Status: AC

## 2018-03-24 NOTE — Patient Instructions (Signed)
Increase sertraline to 1.5 tabs at bedtime. If decreased anxiety.. Try to decrease alprazolam use to every other day then as needed.  IF no improvement after 4 weeks on higher dose.. Can increase to 2 tabs at bedtime.. But let us know.

## 2018-03-24 NOTE — Assessment & Plan Note (Signed)
After antibiotics. Treat with diflucan , may repeat x 1

## 2018-03-24 NOTE — Progress Notes (Signed)
   Subjective:    Patient ID: Laura Gordon, female    DOB: 1971/09/22, 47 y.o.   MRN: [id number]  HPI   47 year old female presents for follow up GAD   On sertraline 100 mg. Using low dose alprazolam daily as needed. Sleeping okay at night using ambien.  She did have a panic attack when at dentist lately. Wt Readings from Last 3 Encounters:  03/24/18 222 lb 12 oz (101 kg)  09/06/17 219 lb 8 oz (99.6 kg)  04/01/17 230 lb 4 oz (104.4 kg)     GAD 7 down form 9 to 6.  GAD 7 : Generalized Anxiety Score 09/06/2017 04/01/2017  Nervous, Anxious, on Edge 1 2  Control/stop worrying 3 2  Worry too much - different things 3 1  Trouble relaxing 0 1  Restless 0 0  Easily annoyed or irritable 1 2  Afraid - awful might happen 1 2  Total GAD 7 Score 9 10     Recent sinus infection.. Jaw pain improved. Treated with amox. Needs fluconazole for vaginal discharge and itching from yeast infection.   Review of Systems  Constitutional: Negative for fatigue and fever.  HENT: Negative for ear pain.   Eyes: Negative for pain.  Respiratory: Negative for chest tightness and shortness of breath.   Cardiovascular: Negative for chest pain, palpitations and leg swelling.  Gastrointestinal: Negative for abdominal pain.  Genitourinary: Negative for dysuria.       Objective:   Physical Exam  Constitutional: Vital signs are normal. She appears well-developed and well-nourished. She is cooperative.  Non-toxic appearance. She does not appear ill. No distress.  HENT:  Head: Normocephalic.  Right Ear: Hearing, tympanic membrane, external ear and ear canal normal. Tympanic membrane is not erythematous, not retracted and not bulging.  Left Ear: Hearing, tympanic membrane, external ear and ear canal normal. Tympanic membrane is not erythematous, not retracted and not bulging.  Nose: No mucosal edema or rhinorrhea. Right sinus exhibits no maxillary sinus tenderness and no frontal sinus tenderness. Left  sinus exhibits no maxillary sinus tenderness and no frontal sinus tenderness.  Mouth/Throat: Uvula is midline, oropharynx is clear and moist and mucous membranes are normal.  Eyes: Pupils are equal, round, and reactive to light. Conjunctivae, EOM and lids are normal. Lids are everted and swept, no foreign bodies found.  Neck: Trachea normal and normal range of motion. Neck supple. Carotid bruit is not present. No thyroid mass and no thyromegaly present.  Cardiovascular: Normal rate, regular rhythm, S1 normal, S2 normal, normal heart sounds, intact distal pulses and normal pulses. Exam reveals no gallop and no friction rub.  No murmur heard. Pulmonary/Chest: Effort normal and breath sounds normal. No tachypnea. No respiratory distress. She has no decreased breath sounds. She has no wheezes. She has no rhonchi. She has no rales.  Abdominal: Soft. Normal appearance and bowel sounds are normal. There is no tenderness.  Neurological: She is alert.  Skin: Skin is warm, dry and intact. No rash noted.  Psychiatric: Her speech is normal and behavior is normal. Judgment and thought content normal. Her mood appears not anxious. Cognition and memory are normal. She does not exhibit a depressed mood.          Assessment & Plan:

## 2018-04-02 ENCOUNTER — Encounter: Payer: Self-pay | Admitting: Family Medicine

## 2018-04-04 ENCOUNTER — Ambulatory Visit: Payer: Self-pay | Admitting: Family Medicine

## 2018-04-04 VITALS — BP 141/89 | HR 110 | Temp 98.9°F | Resp 20

## 2018-04-04 DIAGNOSIS — R51 Headache: Principal | ICD-10-CM

## 2018-04-04 DIAGNOSIS — R519 Headache, unspecified: Secondary | ICD-10-CM

## 2018-04-04 NOTE — Progress Notes (Signed)
Subjective: Dental/facial pain     Laura Gordon is a 47 y.o. female who presents for evaluation of right-sided dental/facial pain that began on 03/31/2018.  Patient reports seeing her dentist on 03/13/2018, who prescribed 7 days of Augmentin for sinusitis that he detected on Panorex incidentally.  Patient reports completing her antibiotics on 03/20/2018.  Patient returned to her dentist on 03/21/2018 and had 2 teeth extracted, upper and lower third molars were removed on the right side.  Patient reports her pain is localized to the site where these teeth were extracted and radiates along the lateral aspect of her right face to her right ear.  Patient describes the pain is intermittent and shock-like sensation.  Denies numbness, tingling, weakness, dysphasia, dysarthria, or paralysis.  Patient reports exacerbating factors include brushing her teeth on the right side of her mouth, chewing with the right side of her mouth, and drinking cold drinks.  Patient denies facial pressure overlying her sinuses or adjacent to her sinuses.  Patient reports a history of allergic rhinitis, with chronic clear rhinorrhea, which she uses Flonase for.  Patient denies any changes in this.  Denies any change in the quality or quantity of nasal discharge.  Denies purulent nasal drainage or nasal congestion.  Denies halitosis or facial pain exacerbated by bending over.  Denies anosmia/hyposmia.  Patient reports noting a low-grade fever on 04/02/2018, T-max 99.8.  Denies rash, nausea, vomiting, diarrhea, SOB, wheezing, cough, chest or back pain, sore throat, difficulty swallowing, confusion, headache, body aches, fatigue, chills, severe symptoms, or initial improvement and then worsening of symptoms. Treatment to date: Flonase, Claritin-D, Sinex.  History of recurrent sinus and/or lung infections: Negative. Patient reports a history of an elevated resting heart rate in the past, denies any symptoms related to this.  Review of  Systems Pertinent items noted in HPI and remainder of comprehensive ROS otherwise negative.     Objective:   Physical Exam General: Awake, alert, and oriented. No acute distress. Well developed, hydrated and nourished. Appears stated age. Nontoxic appearance.  HEENT: No PND noted.  No erythema to posterior oropharynx.  No edema or exudates of pharynx or tonsils. No erythema or bulging of TM.  No erythema/edema to nasal mucosa.  Pale/boggy nasal mucosa.  Sinuses nontender. Supple neck without adenopathy.  Patient endorses TTP to the site of her recent upper/lower third molar extractions.  No erythema, edema, or drainage noted to site of dental extractions. Cardiac: Heart rate elevated at 110.  Normal rhythm. No murmurs, gallops, or rubs are auscultated. S1 and S2 are heard and are of normal intensity.  Respiratory: No signs of respiratory distress. Lungs clear. No tachypnea. Able to speak in full sentences without dyspnea. Nonlabored respirations.  Skin: Skin is warm, dry and intact.  Normal skin turgor.  Appropriate color for ethnicity. No cyanosis noted.   Assessment:   Right-sided facial pain post upper/lower third molar extractions  Plan:  Called patient's dental office ([facility] in [place]) to speak with her dentist to formulate a plan of care and to get additional background information about the care she has received.  Her dentist was unable to speak with me over the phone but advised the patient to come in today to the dental office for evaluation and treatment. Discussed this with the patient and she scheduled an appointment to see her dentist today at 1500.  Discussed red flag symptoms and circumstances with which to seek medical care.

## 2018-04-05 ENCOUNTER — Encounter: Payer: Self-pay | Admitting: Family Medicine

## 2018-04-05 ENCOUNTER — Ambulatory Visit: Payer: Self-pay | Admitting: Family Medicine

## 2018-04-05 DIAGNOSIS — G500 Trigeminal neuralgia: Secondary | ICD-10-CM

## 2018-04-06 ENCOUNTER — Other Ambulatory Visit: Payer: Self-pay | Admitting: *Deleted

## 2018-04-06 ENCOUNTER — Encounter: Payer: Self-pay | Admitting: Family Medicine

## 2018-04-06 ENCOUNTER — Encounter (INDEPENDENT_AMBULATORY_CARE_PROVIDER_SITE_OTHER): Payer: Self-pay

## 2018-04-06 ENCOUNTER — Ambulatory Visit: Payer: Managed Care, Other (non HMO) | Admitting: Family Medicine

## 2018-04-06 NOTE — Telephone Encounter (Signed)
Appointment scheduled today at 4:00 pm with Dr. Ermalene SearingBedsole.

## 2018-04-06 NOTE — Telephone Encounter (Signed)
[name], can you call Ms Hyman HopesWebb and get her in for an appt today, 15 min slot.

## 2018-04-18 ENCOUNTER — Other Ambulatory Visit: Payer: Self-pay | Admitting: Family Medicine

## 2018-04-19 NOTE — Telephone Encounter (Signed)
Last office visit 03/24/2018.  Last refilled 03/16/2018 for #30 with no refills.  Ok to refill?

## 2018-04-20 ENCOUNTER — Encounter: Payer: Self-pay | Admitting: Family Medicine

## 2018-04-20 ENCOUNTER — Other Ambulatory Visit: Payer: Self-pay | Admitting: Family Medicine

## 2018-04-20 MED ORDER — ALPRAZOLAM 0.25 MG PO TABS: 0.2500 mg | ORAL_TABLET | Freq: Two times a day (BID) | ORAL | 0 refills | Status: DC

## 2018-05-08 DIAGNOSIS — G500 Trigeminal neuralgia: Secondary | ICD-10-CM | POA: Insufficient documentation

## 2018-05-17 ENCOUNTER — Encounter: Payer: Self-pay | Admitting: Family Medicine

## 2018-05-18 ENCOUNTER — Encounter: Payer: Self-pay | Admitting: Family Medicine

## 2018-05-18 ENCOUNTER — Other Ambulatory Visit: Payer: Self-pay | Admitting: Family Medicine

## 2018-05-18 MED ORDER — VALSARTAN 80 MG PO TABS: 80.0000 mg | ORAL_TABLET | Freq: Every day | ORAL | 1 refills | Status: DC

## 2018-05-19 ENCOUNTER — Encounter: Payer: Self-pay | Admitting: Family Medicine

## 2018-05-19 ENCOUNTER — Other Ambulatory Visit: Payer: Self-pay | Admitting: Family Medicine

## 2018-05-19 MED ORDER — ZOLPIDEM TARTRATE 10 MG PO TABS: 10.0000 mg | ORAL_TABLET | Freq: Every evening | ORAL | 0 refills | Status: DC | PRN

## 2018-05-19 NOTE — Telephone Encounter (Signed)
Last filled 04-20-18 #30 Last OV 03-24-18 No Future OV  [facility] [address]

## 2018-05-30 ENCOUNTER — Other Ambulatory Visit: Payer: Self-pay | Admitting: Family Medicine

## 2018-05-30 ENCOUNTER — Encounter: Payer: Self-pay | Admitting: Family Medicine

## 2018-05-30 MED ORDER — ALPRAZOLAM 0.25 MG PO TABS: 0.2500 mg | ORAL_TABLET | Freq: Two times a day (BID) | ORAL | 0 refills | Status: DC

## 2018-05-30 NOTE — Telephone Encounter (Signed)
Last office visit 03/24/2018 for Anxiety.  Last refilled 04/20/2018 for #90 with no refill. UDS/Contract 04/04/2017.  Ok to refill?

## 2018-05-30 NOTE — Telephone Encounter (Signed)
Duplicate Request.

## 2018-06-08 ENCOUNTER — Telehealth: Payer: Self-pay

## 2018-06-08 NOTE — Telephone Encounter (Signed)
Not a patient of [facility] °

## 2018-07-07 ENCOUNTER — Other Ambulatory Visit: Payer: Self-pay

## 2018-07-07 ENCOUNTER — Other Ambulatory Visit: Payer: Self-pay | Admitting: Family Medicine

## 2018-07-07 MED ORDER — ALPRAZOLAM 0.25 MG PO TABS: 0.2500 mg | ORAL_TABLET | Freq: Two times a day (BID) | ORAL | 0 refills | Status: DC

## 2018-07-07 NOTE — Telephone Encounter (Signed)
Name of Medication: Alprazolam Name of Pharmacy: [facility] Last Fill or Written Date and Quantity: 05/30/18 #90 Last Office Visit and Type: 03/24/18 Next Office Visit and Type: None scheduled Last Controlled Substance Agreement Date: 04/01/17 Last UDS:04/04/17

## 2018-07-07 NOTE — Telephone Encounter (Signed)
Call pt , I refilled meds but pt will need 6 month follow up for mood eval in 08/2018

## 2018-07-07 NOTE — Telephone Encounter (Signed)
Electronic refill request. Alprazolam Last office visit:   03/24/18 Last Filled:

## 2018-07-20 ENCOUNTER — Other Ambulatory Visit: Payer: Self-pay

## 2018-07-21 ENCOUNTER — Other Ambulatory Visit: Payer: Self-pay

## 2018-07-21 ENCOUNTER — Other Ambulatory Visit: Payer: Self-pay | Admitting: Family Medicine

## 2018-07-21 MED ORDER — ZOLPIDEM TARTRATE 10 MG PO TABS: 10.0000 mg | ORAL_TABLET | Freq: Every evening | ORAL | 0 refills | Status: DC | PRN

## 2018-07-21 NOTE — Telephone Encounter (Signed)
Note    Last office visit 03/24/2018 for Anxiety.  Last refilled 05/19/2018 for #30 with no refill. UDS/Contract 04/04/2017.  Ok to refill?

## 2018-07-21 NOTE — Telephone Encounter (Signed)
Name of Medication:Ambien 10 mg  Name of Pharmacy: [facility] [address] Last Fill or Written Date and Quantity: # 30 on 05/19/18 Last Office Visit and Type: 03/24/18 FU Next Office Visit and Type: none scheduled Last Controlled Substance Agreement Date: 04/01/17 Last UDS:04/04/17

## 2018-08-16 ENCOUNTER — Other Ambulatory Visit: Payer: Self-pay | Admitting: Family Medicine

## 2018-08-16 NOTE — Telephone Encounter (Signed)
Last office visit 03/24/2018 for Anxiety.  No future appointments.  Last refilled 07/07/2018 for #90 with no refills.

## 2018-09-14 ENCOUNTER — Other Ambulatory Visit: Payer: Self-pay | Admitting: Family Medicine

## 2018-09-15 ENCOUNTER — Other Ambulatory Visit: Payer: Self-pay | Admitting: Family Medicine

## 2018-09-15 MED ORDER — ZOLPIDEM TARTRATE 10 MG PO TABS: 10.0000 mg | ORAL_TABLET | Freq: Every evening | ORAL | 0 refills | Status: DC | PRN

## 2018-09-15 NOTE — Telephone Encounter (Signed)
Pt called and sched her physical and lab appt for feb 2020

## 2018-09-15 NOTE — Telephone Encounter (Signed)
Last office visit 03/24/2018 for Anxiety.  Last refilled 07/21/2018 for #30 with no refills.  No future appointments.  I have sent Zella BallRobin a message asking her to schedule CPE.  Ok to refill?

## 2018-09-15 NOTE — Telephone Encounter (Signed)
Laura Gordon, please schedule CPE with fasting labs prior.  Must schedule appointment before her medication can be refilled.  Please send back to [name] once appointment is made for her to refill. Thanks!

## 2018-09-15 NOTE — Telephone Encounter (Signed)
Left message asking pt to call office     Robin, please schedule CPE with fasting labs prior.  Must schedule appointment before her medication can be refilled.  Please send back to [name] once appointment is made for her to refill. Thanks!

## 2018-09-15 NOTE — Telephone Encounter (Signed)
Pt called and I advised her she need to she a CPE before her medication can be refilled and pt stated she would go on line and sched herself. I advised pt to send us a mychart message to let us know when it is schedule so we can let nurse know to refill her medications.

## 2018-09-15 NOTE — Telephone Encounter (Signed)
Left message asking pt to call office  °

## 2018-09-15 NOTE — Telephone Encounter (Signed)
Robin, please schedule CPE with fasting labs prior.  Must schedule appointment before her medication can be refilled.  Please send back to Donna once appointment is made for her to refill. Thanks!  

## 2018-10-01 ENCOUNTER — Other Ambulatory Visit: Payer: Self-pay | Admitting: Family Medicine

## 2018-10-02 NOTE — Telephone Encounter (Signed)
Last office visit 03/24/2018 for GAD.  Last refilled 08/16/2018 for #90 with no refills.  Next Appt: 12/08/2018 for CPE.

## 2018-10-22 ENCOUNTER — Other Ambulatory Visit: Payer: Self-pay | Admitting: Family Medicine

## 2018-10-23 ENCOUNTER — Other Ambulatory Visit: Payer: Self-pay

## 2018-10-23 NOTE — Telephone Encounter (Signed)
Last office visit 03/24/2018 for GAD.   Last refilled 09/15/2018 for #30 with no refills.  Next Appt: 12/08/2018 for CPE.

## 2018-10-23 NOTE — Telephone Encounter (Signed)
Duplicate request.  We have already received a request from her pharmacy.

## 2018-11-13 ENCOUNTER — Other Ambulatory Visit: Payer: Self-pay | Admitting: Family Medicine

## 2018-11-13 ENCOUNTER — Other Ambulatory Visit: Payer: Self-pay

## 2018-11-13 NOTE — Telephone Encounter (Signed)
Last office visit 03/24/2018 for GAD.  Last refilled 10/03/2018 for #90 wit no refills.  CPE scheduled 12/08/2018.  UDS/Contract 04/04/2017.

## 2018-11-14 MED ORDER — ALPRAZOLAM 0.25 MG PO TABS: 0.2500 mg | ORAL_TABLET | Freq: Two times a day (BID) | ORAL | 0 refills | Status: DC

## 2018-11-21 ENCOUNTER — Other Ambulatory Visit: Payer: Self-pay | Admitting: Family Medicine

## 2018-11-21 NOTE — Telephone Encounter (Signed)
Last office visit 03/24/2018.  Last refilled 10/24/2018 for #30 with no refills.  CPE scheduled 12/08/2018.

## 2018-12-01 ENCOUNTER — Telehealth: Payer: Self-pay | Admitting: Family Medicine

## 2018-12-01 DIAGNOSIS — E7800 Pure hypercholesterolemia, unspecified: Secondary | ICD-10-CM

## 2018-12-01 DIAGNOSIS — Z8639 Personal history of other endocrine, nutritional and metabolic disease: Secondary | ICD-10-CM

## 2018-12-01 NOTE — Telephone Encounter (Signed)
-----   Message from Alvina Chou sent at 11/27/2018 12:44 PM EST ----- Regarding: Lab orders for Monday, 2.10.20 Patient is scheduled for CPX labs, please order future labs, Thanks , Camelia Eng

## 2018-12-04 ENCOUNTER — Other Ambulatory Visit: Payer: Managed Care, Other (non HMO)

## 2018-12-08 ENCOUNTER — Ambulatory Visit (INDEPENDENT_AMBULATORY_CARE_PROVIDER_SITE_OTHER): Payer: Managed Care, Other (non HMO) | Admitting: Family Medicine

## 2018-12-08 ENCOUNTER — Other Ambulatory Visit (HOSPITAL_COMMUNITY)
Admission: RE | Admit: 2018-12-08 | Discharge: 2018-12-08 | Disposition: A | Discharge status: Home / Self Care | Payer: Managed Care, Other (non HMO) | Source: Ambulatory Visit | Attending: Family Medicine | Admitting: Family Medicine

## 2018-12-08 ENCOUNTER — Encounter: Payer: Self-pay | Admitting: Family Medicine

## 2018-12-08 VITALS — BP 118/72 | HR 102 | Temp 98.6°F | Ht 67.5 in | Wt 239.0 lb

## 2018-12-08 DIAGNOSIS — Z124 Encounter for screening for malignant neoplasm of cervix: Secondary | ICD-10-CM

## 2018-12-08 DIAGNOSIS — Z8639 Personal history of other endocrine, nutritional and metabolic disease: Secondary | ICD-10-CM

## 2018-12-08 DIAGNOSIS — F411 Generalized anxiety disorder: Secondary | ICD-10-CM

## 2018-12-08 DIAGNOSIS — Z8679 Personal history of other diseases of the circulatory system: Secondary | ICD-10-CM

## 2018-12-08 DIAGNOSIS — Z0000 Encounter for general adult medical examination without abnormal findings: Secondary | ICD-10-CM | POA: Diagnosis not present

## 2018-12-08 DIAGNOSIS — E7800 Pure hypercholesterolemia, unspecified: Secondary | ICD-10-CM

## 2018-12-08 MED ORDER — ESCITALOPRAM OXALATE 20 MG PO TABS: 20.0000 mg | ORAL_TABLET | Freq: Every day | ORAL | 3 refills | Status: DC

## 2018-12-08 NOTE — Assessment & Plan Note (Signed)
Re-eval. 

## 2018-12-08 NOTE — Assessment & Plan Note (Signed)
Resolved

## 2018-12-08 NOTE — Assessment & Plan Note (Signed)
Encouraged exercise, weight loss, healthy eating habits. ? ?

## 2018-12-08 NOTE — Assessment & Plan Note (Signed)
Stop sertraline and change to lexapro. Follow up in 4 weeks. Increase exercise and stress reduction.

## 2018-12-08 NOTE — Progress Notes (Signed)
Subjective:    Patient ID: Laura Gordon, female    DOB: 1971/02/18, 48 y.o.   MRN: [id number]  HPI The patient is here for annual wellness exam and preventative care.    Elevated Cholesterol: HX of TIA .Marland Kitchen DUe for re-eval. Lab Results  Component Value Date   CHOL 137 09/06/2017   HDL 49.60 09/06/2017   LDLCALC 66 09/06/2017   LDLDIRECT 228.0 07/03/2013   TRIG 107.0 09/06/2017   CHOLHDL 3 09/06/2017  Using medications without problems: Muscle aches:  Diet compliance: moderate Exercise: poor Wt Readings from Last 3 Encounters:  12/08/18 239 lb (108.4 kg)  03/24/18 222 lb 12 oz (101 kg)  09/06/17 219 lb 8 oz (99.6 kg)    Other complaints:   HX of DM due for re-eval.  Trigeminal neuralgia: treated by Dr. Malvin Johns Moderate control of pain with acupuncture, gabapentin, nortriptiline  GAD:  moderate control on 150 mg of sertraline, Son with suicide attempt  And this shook her up ( 03/2019) uses alprazolam 0.25 mg as needed daily... currently using once daily Uses ambien for sleep. Insomnia improved some now working days.  GAD7 8  Social History /Family History/Past Medical History reviewed in detail and updated in EMR if needed. Blood pressure 118/72, pulse (!) 102, temperature 98.6 F (37 C), temperature source Oral, height 5' 7.5" (1.715 m), weight 239 lb (108.4 kg).  Review of Systems  Constitutional: Negative for fatigue and fever.  HENT: Negative for congestion.   Eyes: Negative for pain.  Respiratory: Negative for cough and shortness of breath.   Cardiovascular: Negative for chest pain, palpitations and leg swelling.  Gastrointestinal: Negative for abdominal pain.  Genitourinary: Negative for dysuria and vaginal bleeding.  Musculoskeletal: Negative for back pain.  Neurological: Negative for syncope, light-headedness and headaches.  Psychiatric/Behavioral: Positive for agitation and sleep disturbance. Negative for dysphoric mood and suicidal ideas. The patient is  nervous/anxious.        Objective:   Physical Exam Exam conducted with a chaperone present.  Constitutional:      General: She is not in acute distress.    Appearance: Normal appearance. She is well-developed. She is not ill-appearing or toxic-appearing.  HENT:     Head: Normocephalic.     Right Ear: Hearing, tympanic membrane, ear canal and external ear normal.     Left Ear: Hearing, tympanic membrane, ear canal and external ear normal.     Nose: Nose normal.  Eyes:     General: Lids are normal. Lids are everted, no foreign bodies appreciated.     Conjunctiva/sclera: Conjunctivae normal.     Pupils: Pupils are equal, round, and reactive to light.  Neck:     Musculoskeletal: Normal range of motion and neck supple.     Thyroid: No thyroid mass or thyromegaly.     Vascular: No carotid bruit.     Trachea: Trachea normal.  Cardiovascular:     Rate and Rhythm: Normal rate and regular rhythm.     Heart sounds: Normal heart sounds, S1 normal and S2 normal. No murmur. No gallop.   Pulmonary:     Effort: Pulmonary effort is normal. No respiratory distress.     Breath sounds: Normal breath sounds. No wheezing, rhonchi or rales.  Abdominal:     General: Bowel sounds are normal. There is no distension or abdominal bruit.     Palpations: Abdomen is soft. There is no fluid wave or mass.     Tenderness: There is no abdominal tenderness.  There is no guarding or rebound.     Hernia: No hernia is present. There is no hernia in the right inguinal area or left inguinal area.  Genitourinary:    General: Normal vulva.     Exam position: Supine.     Labia:        Right: No rash.        Left: No rash.      Vagina: Normal.     Cervix: No cervical motion tenderness, discharge or friability.     Uterus: Normal.      Adnexa: Right adnexa normal and left adnexa normal.  Lymphadenopathy:     Cervical: No cervical adenopathy.     Lower Body: No right inguinal adenopathy. No left inguinal adenopathy.    Skin:    General: Skin is warm and dry.     Findings: No rash.  Neurological:     Mental Status: She is alert.     Cranial Nerves: No cranial nerve deficit.     Sensory: No sensory deficit.  Psychiatric:        Mood and Affect: Mood is not anxious or depressed. Affect is blunt.        Speech: Speech normal.        Behavior: Behavior is withdrawn. Behavior is cooperative.        Judgment: Judgment normal.           Assessment & Plan:  The patient's preventative maintenance and recommended screening tests for an annual wellness exam were reviewed in full today. Brought up to date unless services declined.  Counselled on the importance of diet, exercise, and its role in overall health and mortality. The patient's FH and SH was reviewed, including their home life, tobacco status, and drug and alcohol status.   Vaccines: Uptodate flu.  She thinks she had td at work.. will get records. PAP/DVE: 06/2013 nml, neg HPV,on q5years  former 20 pack years! Stopped in 2014. No daily cough mammo: last nml 2013.. plan yearly. No early colon cancer in family. Refused HIV screen.

## 2018-12-08 NOTE — Patient Instructions (Addendum)
Stop sertraline.  Start lexapro at bedtime.  Try to reduce your use of alprazolam and ambien as you can. Change to as needed.  Call to set up mammogram on your own.

## 2018-12-08 NOTE — Addendum Note (Signed)
Addended by: Alvina Chou on: 12/08/2018 04:20 PM   Modules accepted: Orders

## 2018-12-09 LAB — COMPREHENSIVE METABOLIC PANEL
AG Ratio: 1.5 (calc) (ref 1.0–2.5)
ALBUMIN MSPROF: 4 g/dL (ref 3.6–5.1)
ALT: 26 U/L (ref 6–29)
AST: 18 U/L (ref 10–35)
Alkaline phosphatase (APISO): 61 U/L (ref 31–125)
BUN: 13 mg/dL (ref 7–25)
CHLORIDE: 106 mmol/L (ref 98–110)
CO2: 30 mmol/L (ref 20–32)
Calcium: 9.1 mg/dL (ref 8.6–10.2)
Creat: 0.79 mg/dL (ref 0.50–1.10)
GLOBULIN: 2.7 g/dL (ref 1.9–3.7)
Glucose, Bld: 142 mg/dL — ABNORMAL HIGH (ref 65–99)
Potassium: 3.6 mmol/L (ref 3.5–5.3)
Sodium: 146 mmol/L (ref 135–146)
Total Bilirubin: 0.2 mg/dL (ref 0.2–1.2)
Total Protein: 6.7 g/dL (ref 6.1–8.1)

## 2018-12-09 LAB — HEMOGLOBIN A1C
Hgb A1c MFr Bld: 6.1 % of total Hgb — ABNORMAL HIGH (ref <5.7)
MEAN PLASMA GLUCOSE: 128 (calc)
eAG (mmol/L): 7.1 (calc)

## 2018-12-09 LAB — LIPID PANEL
Cholesterol: 169 mg/dL (ref <200)
HDL: 46 mg/dL — ABNORMAL LOW (ref >=50)
LDL Cholesterol (Calc): 95 mg/dL (calc)
Non-HDL Cholesterol (Calc): 123 mg/dL (calc) (ref <130)
Total CHOL/HDL Ratio: 3.7 (calc) (ref <5.0)
Triglycerides: 188 mg/dL — ABNORMAL HIGH (ref <150)

## 2018-12-11 ENCOUNTER — Other Ambulatory Visit: Payer: Self-pay | Admitting: Family Medicine

## 2018-12-11 DIAGNOSIS — Z1231 Encounter for screening mammogram for malignant neoplasm of breast: Secondary | ICD-10-CM

## 2018-12-12 LAB — CYTOLOGY - PAP
Diagnosis: NEGATIVE
HPV: NOT DETECTED

## 2018-12-14 ENCOUNTER — Other Ambulatory Visit: Payer: Self-pay | Admitting: Family Medicine

## 2018-12-20 ENCOUNTER — Ambulatory Visit
Admission: RE | Admit: 2018-12-20 | Discharge: 2018-12-20 | Disposition: A | Discharge status: Home / Self Care | Payer: Managed Care, Other (non HMO) | Source: Ambulatory Visit | Attending: Family Medicine | Admitting: Family Medicine

## 2018-12-20 DIAGNOSIS — Z1231 Encounter for screening mammogram for malignant neoplasm of breast: Secondary | ICD-10-CM | POA: Insufficient documentation

## 2018-12-25 ENCOUNTER — Other Ambulatory Visit: Payer: Self-pay | Admitting: Family Medicine

## 2018-12-25 NOTE — Telephone Encounter (Signed)
Last office visit 12/08/2018 for CPE.  Last refilled Zolpidem 11/23/2018 for #30 with no refills. Alprazolam 11/14/2018 for #90 with no refills.  No future appointments.  UDS/Contract 04/04/2017.

## 2018-12-26 ENCOUNTER — Other Ambulatory Visit: Payer: Self-pay

## 2018-12-26 MED ORDER — ATORVASTATIN CALCIUM 40 MG PO TABS: 40.0000 mg | ORAL_TABLET | Freq: Every day | ORAL | 3 refills | Status: DC

## 2018-12-26 MED ORDER — ZOLPIDEM TARTRATE 10 MG PO TABS: 10.0000 mg | ORAL_TABLET | Freq: Every evening | ORAL | 0 refills | Status: DC | PRN

## 2018-12-26 MED ORDER — ALPRAZOLAM 0.25 MG PO TABS: 0.2500 mg | ORAL_TABLET | Freq: Two times a day (BID) | ORAL | 0 refills | Status: DC

## 2018-12-26 NOTE — Telephone Encounter (Signed)
Last office visit 12/08/2018 for CPE.  Last refilled Alprazolam 11/14/2018 for #90 with no refills.  Zolpidem 11/23/2018 for #30 with no refills.  No future appointments.

## 2019-02-22 ENCOUNTER — Other Ambulatory Visit: Payer: Self-pay | Admitting: Family Medicine

## 2019-02-22 NOTE — Telephone Encounter (Signed)
Last office visit 12/08/2018 for CPE.  Last refilled 12/26/2018 for #30 with no refills.  No future appointments.

## 2019-03-01 ENCOUNTER — Encounter: Payer: Self-pay | Admitting: *Deleted

## 2019-03-02 ENCOUNTER — Ambulatory Visit (INDEPENDENT_AMBULATORY_CARE_PROVIDER_SITE_OTHER): Payer: Managed Care, Other (non HMO) | Admitting: Family Medicine

## 2019-03-02 ENCOUNTER — Encounter: Payer: Self-pay | Admitting: Family Medicine

## 2019-03-02 VITALS — BP 140/85 | HR 96 | Ht 67.5 in

## 2019-03-02 DIAGNOSIS — F5104 Psychophysiologic insomnia: Secondary | ICD-10-CM

## 2019-03-02 DIAGNOSIS — F411 Generalized anxiety disorder: Secondary | ICD-10-CM

## 2019-03-02 NOTE — Assessment & Plan Note (Signed)
Good control on ambien.

## 2019-03-02 NOTE — Progress Notes (Signed)
VIRTUAL VISIT Due to national recommendations of social distancing due to COVID 19, a virtual visit is felt to be most appropriate for this patient at this time.   I connected with the patient on 03/02/19 at  3:40 PM EDT by virtual telehealth platform and verified that I am speaking with the correct person using two identifiers.   I discussed the limitations, risks, security and privacy concerns of performing an evaluation and management service by  virtual telehealth platform and the availability of in person appointments. I also discussed with the patient that there may be a patient responsible charge related to this service. The patient expressed understanding and agreed to proceed.  Patient location: Home Provider [facility] Participants: Kerby NoraAmy Lummie Montijo and Fredric Mareassandra P Flesch   Chief Complaint  Patient presents with  . Follow-up    Mood    History of Present Illness: 48 year old female presents for follow up GAD and chronic insomnia.  She is currently on lexapro 20 mg daily, Ambien at bedtime for insomnia .  She uses alprazolam 1 tabs daily prn anxiety... only has to use it every other day.. this is less than before.   No SE to lexapro.  She is sleeping 6-7 hours a night. No SI. GAD 7 : Generalized Anxiety Score 03/02/2019 12/08/2018 03/24/2018 09/06/2017  Nervous, Anxious, on Edge 1 1 1 1   Control/stop worrying 1 1 1 3   Worry too much - different things 1 1 1 3   Trouble relaxing 1 1 1  0  Restless 1 1 0 0  Easily annoyed or irritable 0 1 1 1   Afraid - awful might happen 1 2 1 1   Total GAD 7 Score 6 8 6 9   Anxiety Difficulty Somewhat difficult - - -    [facility] 12/08/2018 04/01/2017  Decreased Interest 0 0  Down, Depressed, Hopeless 0 0  PHQ - 2 Score 0 0  Altered sleeping - 0  Tired, decreased energy - 1  Change in appetite - 1  Feeling bad or failure about yourself  - 0  Trouble concentrating - 0  Moving slowly or fidgety/restless - 0   Suicidal thoughts - 0  PHQ-9 Score - 2    COVID 19 screen No recent travel or known exposure to COVID19 The patient denies respiratory symptoms of COVID 19 at this time.  The importance of social distancing was discussed today.   Review of Systems  Constitutional: Negative for chills and fever.  HENT: Negative for congestion and ear pain.   Eyes: Negative for pain and redness.  Respiratory: Negative for cough and shortness of breath.   Cardiovascular: Negative for chest pain, palpitations and leg swelling.  Gastrointestinal: Negative for abdominal pain, blood in stool, constipation, diarrhea, nausea and vomiting.  Genitourinary: Negative for dysuria.  Musculoskeletal: Negative for falls and myalgias.  Skin: Negative for rash.  Neurological: Negative for dizziness.  Psychiatric/Behavioral: Negative for depression. The patient is not nervous/anxious.       Past Medical History:  Diagnosis Date  . Depression     reports that she has quit smoking. Her smoking use included cigarettes. She has a 20.00 pack-year smoking history. She has never used smokeless tobacco. She reports that she does not drink alcohol or use drugs.   Current Outpatient Medications:  .  ALPRAZolam (XANAX) 0.25 MG tablet, TAKE 1 TO 2 TABLETS BY MOUTH TWICE DAILY, Disp: 90 tablet, Rfl: 0 .  aspirin EC 81 MG EC tablet, Take 1  tablet (81 mg total) by mouth daily., Disp: 30 tablet, Rfl: 0 .  atorvastatin (LIPITOR) 40 MG tablet, Take 1 tablet (40 mg total) by mouth daily., Disp: 90 tablet, Rfl: 3 .  carbamazepine (TEGRETOL XR) 100 MG 12 hr tablet, Take 200 mg by mouth 2 (two) times daily. , Disp: , Rfl:  .  escitalopram (LEXAPRO) 20 MG tablet, Take 1 tablet (20 mg total) by mouth daily., Disp: 30 tablet, Rfl: 3 .  fluticasone (FLONASE) 50 MCG/ACT nasal spray, USE TWO SPRAY(S) IN EACH NOSTRIL ONCE DAILY, Disp: 16 g, Rfl: 11 .  gabapentin (NEURONTIN) 300 MG capsule, TAKE 2 CAPSULES BY MOUTH THREE TIMES DAILY AS NEEDED,  Disp: , Rfl:  .  Multiple Vitamin (MULTIVITAMIN) tablet, Take 1 tablet by mouth daily. , Disp: , Rfl:  .  nortriptyline (PAMELOR) 10 MG capsule, Take 10-20 mg by mouth daily as needed for sleep., Disp: , Rfl:  .  valsartan (DIOVAN) 80 MG tablet, TAKE 1 TABLET BY MOUTH ONCE DAILY, Disp: 90 tablet, Rfl: 1 .  zolpidem (AMBIEN) 10 MG tablet, TAKE 1 TABLET BY MOUTH AT BEDTIME AS NEEDED FOR SLEEP, Disp: 30 tablet, Rfl: 0   Observations/Objective: Blood pressure 140/85, pulse 96, height 5' 7.5" (1.715 m).  Physical Exam  Physical Exam Constitutional:      General: The patient is not in acute distress. Pulmonary:     Effort: Pulmonary effort is normal. No respiratory distress.  Neurological:     Mental Status: The patient is alert and oriented to person, place, and time.  Psychiatric:        Mood and Affect: Mood normal.        Behavior: Behavior normal.   Assessment and Plan Generalized anxiety disorder Well controlled. Continue current medication. Stress reduction and relaxation through increased stress of covid pandemic.  Continue to work on decreasing alprazolam use overall.  No red flags on NCCSR database for controlled substance misuse.  Chronic insomnia Good control on ambien.     I discussed the assessment and treatment plan with the patient. The patient was provided an opportunity to ask questions and all were answered. The patient agreed with the plan and demonstrated an understanding of the instructions.   The patient was advised to call back or seek an in-person evaluation if the symptoms worsen or if the condition fails to improve as anticipated.     Kerby Nora, MD

## 2019-03-02 NOTE — Assessment & Plan Note (Addendum)
Well controlled. Continue current medication. Stress reduction and relaxation through increased stress of covid pandemic.  Continue to work on decreasing alprazolam use overall.  No red flags on NCCSR database for controlled substance misuse.

## 2019-03-26 ENCOUNTER — Other Ambulatory Visit: Payer: Self-pay | Admitting: Family Medicine

## 2019-03-26 NOTE — Telephone Encounter (Signed)
Last office visit 03/02/2019 for GAD.  Last refilled 02/22/2019 for #30 with no refills.  No future appointments.

## 2019-04-09 ENCOUNTER — Other Ambulatory Visit: Payer: Self-pay

## 2019-04-09 ENCOUNTER — Other Ambulatory Visit: Payer: Self-pay | Admitting: Family Medicine

## 2019-04-09 NOTE — Telephone Encounter (Signed)
Alprazolam last filled 12-26-18 #90 Last OV 03-02-19 No Future OV [facility] [address]

## 2019-04-25 ENCOUNTER — Other Ambulatory Visit: Payer: Self-pay | Admitting: Family Medicine

## 2019-04-25 NOTE — Telephone Encounter (Signed)
Last office visit 03/02/2019 for GAD.  Last refilled 03/27/2019 for #30 with no refills.  No future appointments.

## 2019-05-23 ENCOUNTER — Other Ambulatory Visit: Payer: Self-pay | Admitting: Family Medicine

## 2019-05-24 NOTE — Telephone Encounter (Signed)
Last office visit 03/02/2019 for GAD.  Last refilled for #30 with no refills.  No future appointments.

## 2019-06-11 ENCOUNTER — Other Ambulatory Visit: Payer: Self-pay | Admitting: Family Medicine

## 2019-06-20 ENCOUNTER — Other Ambulatory Visit: Payer: Self-pay | Admitting: Family Medicine

## 2019-06-20 NOTE — Telephone Encounter (Signed)
Last office visit 03/02/2019 for GAD.  Last refilled 04/10/2019 for #90 with no refills.  No future appointments.

## 2019-07-11 ENCOUNTER — Other Ambulatory Visit: Payer: Self-pay | Admitting: *Deleted

## 2019-07-11 DIAGNOSIS — Z20822 Contact with and (suspected) exposure to covid-19: Secondary | ICD-10-CM

## 2019-07-12 LAB — NOVEL CORONAVIRUS, NAA: SARS-CoV-2, NAA: NOT DETECTED

## 2019-07-24 ENCOUNTER — Other Ambulatory Visit: Payer: Self-pay | Admitting: Family Medicine

## 2019-07-24 NOTE — Telephone Encounter (Signed)
Last office visit 03/02/2019 for GAD.  Last refilled 05/24/2019 for #30 with 1 refill.  No future appointments. UDS/Contract 04/04/2017.

## 2019-08-21 ENCOUNTER — Other Ambulatory Visit: Payer: Self-pay | Admitting: Family Medicine

## 2019-08-21 NOTE — Telephone Encounter (Signed)
Last office visit 03/02/2019 for GAD.  Last refilled 07/24/2019 for #30 with no refills.  No future appointments.

## 2019-09-11 ENCOUNTER — Other Ambulatory Visit: Payer: Self-pay | Admitting: Family Medicine

## 2019-09-11 NOTE — Telephone Encounter (Signed)
Last office visit 03/02/2019 for GAD.  Last refilled 06/21/2019 for #90 with no refills.  No future appointments.

## 2019-09-25 ENCOUNTER — Other Ambulatory Visit: Payer: Self-pay | Admitting: Family Medicine

## 2019-09-25 NOTE — Telephone Encounter (Signed)
Last office visit 03/02/2019 for GAD.  Last refilled 08/21/2019 for #30 with no refill.  No future appointments.

## 2019-10-24 ENCOUNTER — Other Ambulatory Visit: Payer: Self-pay | Admitting: Family Medicine

## 2019-10-24 NOTE — Telephone Encounter (Signed)
Last office visit 03/02/2019 for GAD/Insomnia.  Last refilled 09/25/2019 for #30 with no refills.  No future appointments.

## 2019-10-29 ENCOUNTER — Telehealth: Payer: Self-pay

## 2019-10-29 ENCOUNTER — Other Ambulatory Visit: Payer: Self-pay | Admitting: Critical Care Medicine

## 2019-10-29 ENCOUNTER — Telehealth: Payer: Self-pay | Admitting: Critical Care Medicine

## 2019-10-29 DIAGNOSIS — U071 COVID-19: Secondary | ICD-10-CM

## 2019-10-29 DIAGNOSIS — Z6838 Body mass index (BMI) 38.0-38.9, adult: Secondary | ICD-10-CM

## 2019-10-29 NOTE — Telephone Encounter (Signed)
I connected this patient who is Covid positive from a December 31 testing event at the [facility] department.  She has been symptomatic since that day with cough shortness of breath weakness low-grade fever severe fatigue.  She is a candidate for monoclonal antibody as her BMI is greater than 35  Called to discuss with patient about Covid symptoms and the use of bamlanivimab, a monoclonal antibody infusion for those with mild to moderate Covid symptoms and at a high risk of hospitalization.  Pt is qualified for this infusion at the [facility] infusion center due to BMI>35    Her symptoms will be well within the 10-day.  If she is infused on 7 January which is when we have scheduled her for the infusion

## 2019-10-29 NOTE — Telephone Encounter (Signed)
Noted  

## 2019-10-29 NOTE — Progress Notes (Signed)
  I connected by phone with Laura Gordon on 10/29/2019 at 4:07 PM to discuss the potential use of an new treatment for mild to moderate COVID-19 viral infection in non-hospitalized patients.  This patient is a 49 y.o. female that meets the FDA criteria for Emergency Use Authorization of bamlanivimab or casirivimab\imdevimab.  Has a (+) direct SARS-CoV-2 viral test result  Has mild or moderate COVID-19   Is ? 51 years of age and weighs ? 40 kg  Is NOT hospitalized due to COVID-19  Is NOT requiring oxygen therapy or requiring an increase in baseline oxygen flow rate due to COVID-19  Is within 10 days of symptom onset  Has at least one of the high risk factor(s) for progression to severe COVID-19 and/or hospitalization as defined in EUA.  Specific high risk criteria : BMI >/= 35   I have spoken and communicated the following to the patient or parent/caregiver:  1. FDA has authorized the emergency use of bamlanivimab and casirivimab\imdevimab for the treatment of mild to moderate COVID-19 in adults and pediatric patients with positive results of direct SARS-CoV-2 viral testing who are 68 years of age and older weighing at least 40 kg, and who are at high risk for progressing to severe COVID-19 and/or hospitalization.  2. The significant known and potential risks and benefits of bamlanivimab and casirivimab\imdevimab, and the extent to which such potential risks and benefits are unknown.  3. Information on available alternative treatments and the risks and benefits of those alternatives, including clinical trials.  4. Patients treated with bamlanivimab and casirivimab\imdevimab should continue to self-isolate and use infection control measures (e.g., wear mask, isolate, social distance, avoid sharing personal items, clean and disinfect "high touch" surfaces, and frequent handwashing) according to CDC guidelines.   5. The patient or parent/caregiver has the option to accept or refuse  bamlanivimab or casirivimab\imdevimab .  After reviewing this information with the patient, The patient agreed to proceed with receiving the bamlanimivab infusion and will be provided a copy of the Fact sheet prior to receiving the infusion.[name] 10/29/2019 4:07 PM

## 2019-10-29 NOTE — Telephone Encounter (Signed)
After communicating with the patient, I placed a telephone referral to the [facility] administering the outpatient monoclonal antibody treatment for COVID-19. Left message with patient information; awaiting return call

## 2019-11-01 ENCOUNTER — Ambulatory Visit (HOSPITAL_COMMUNITY)
Admission: RE | Admit: 2019-11-01 | Discharge: 2019-11-01 | Disposition: A | Discharge status: Home / Self Care | Payer: Managed Care, Other (non HMO) | Source: Ambulatory Visit | Attending: Pulmonary Disease | Admitting: Pulmonary Disease

## 2019-11-01 DIAGNOSIS — U071 COVID-19: Secondary | ICD-10-CM | POA: Diagnosis present

## 2019-11-01 DIAGNOSIS — Z6838 Body mass index (BMI) 38.0-38.9, adult: Secondary | ICD-10-CM | POA: Diagnosis present

## 2019-11-01 DIAGNOSIS — Z23 Encounter for immunization: Secondary | ICD-10-CM | POA: Insufficient documentation

## 2019-11-01 MED ORDER — SODIUM CHLORIDE 0.9 % IV SOLN
700.0000 mg | Freq: Once | INTRAVENOUS | Status: AC
  Administered 2019-11-01: 13:00:00 700 mg via INTRAVENOUS
  Filled 2019-11-01: qty 20

## 2019-11-01 MED ORDER — FAMOTIDINE IN NACL 20-0.9 MG/50ML-% IV SOLN: 20.0000 mg | Freq: Once | INTRAVENOUS | Status: DC | PRN

## 2019-11-01 MED ORDER — ALBUTEROL SULFATE HFA 108 (90 BASE) MCG/ACT IN AERS: 2.0000 | INHALATION_SPRAY | Freq: Once | RESPIRATORY_TRACT | Status: DC | PRN

## 2019-11-01 MED ORDER — METHYLPREDNISOLONE SODIUM SUCC 125 MG IJ SOLR: 125.0000 mg | Freq: Once | INTRAMUSCULAR | Status: DC | PRN

## 2019-11-01 MED ORDER — DIPHENHYDRAMINE HCL 50 MG/ML IJ SOLN: 50.0000 mg | Freq: Once | INTRAMUSCULAR | Status: DC | PRN

## 2019-11-01 MED ORDER — EPINEPHRINE 0.3 MG/0.3ML IJ SOAJ: 0.3000 mg | Freq: Once | INTRAMUSCULAR | Status: DC | PRN

## 2019-11-01 MED ORDER — SODIUM CHLORIDE 0.9 % IV SOLN: INTRAVENOUS | Status: DC | PRN

## 2019-11-01 NOTE — Discharge Instructions (Signed)

## 2019-11-01 NOTE — Progress Notes (Signed)
.   Diagnosis: COVID-19  Physician: Dr. Wright  Procedure: [facility]fusion [facility] Med: bamlanivimab infusion - Provided patient with bamlanimivab fact sheet for patients, parents and caregivers prior to infusion.  Complications: No immediate complications noted.  Discharge: Discharged home   Laura Gordon A 11/01/2019   

## 2019-11-20 ENCOUNTER — Other Ambulatory Visit: Payer: Self-pay | Admitting: Family Medicine

## 2019-11-21 ENCOUNTER — Other Ambulatory Visit: Payer: Self-pay | Admitting: Family Medicine

## 2019-11-21 NOTE — Telephone Encounter (Signed)
Left message asking pt to call office  °

## 2019-11-21 NOTE — Telephone Encounter (Signed)
Patient scheduled f/u appointment on 12/14/19.

## 2019-11-21 NOTE — Telephone Encounter (Signed)
Patient needs follow up appointment when possible. LOV was may 2020. Thank you

## 2019-11-22 NOTE — Telephone Encounter (Signed)
Last office visit 03/02/2019 for GAD.  Last refilled 10/24/2019 for #30 with no refills.  Next Appt: 12/14/2019 for medication refill.

## 2019-12-03 ENCOUNTER — Other Ambulatory Visit: Payer: Self-pay

## 2019-12-03 ENCOUNTER — Other Ambulatory Visit: Payer: Self-pay | Admitting: Family Medicine

## 2019-12-03 NOTE — Telephone Encounter (Signed)
Last office visit 03/02/2019 for GAD.  Last refilled 09/12/2019 for #90 with no refills.  Next Appt: 12/14/2019 for med refills.

## 2019-12-04 NOTE — Telephone Encounter (Signed)
Last office visit 03/02/2019 for GAD.  Last refilled 09/22/2019 for #90 with no refills.  Next Appt: 12/14/2019 for med refill.

## 2019-12-04 NOTE — Telephone Encounter (Signed)
Duplicate request

## 2019-12-14 ENCOUNTER — Encounter: Payer: Self-pay | Admitting: Family Medicine

## 2019-12-14 ENCOUNTER — Other Ambulatory Visit: Payer: Self-pay

## 2019-12-14 ENCOUNTER — Ambulatory Visit (INDEPENDENT_AMBULATORY_CARE_PROVIDER_SITE_OTHER): Payer: Managed Care, Other (non HMO) | Admitting: Family Medicine

## 2019-12-14 ENCOUNTER — Ambulatory Visit: Payer: Managed Care, Other (non HMO) | Admitting: Family Medicine

## 2019-12-14 VITALS — Ht 67.5 in

## 2019-12-14 DIAGNOSIS — F411 Generalized anxiety disorder: Secondary | ICD-10-CM | POA: Diagnosis not present

## 2019-12-14 DIAGNOSIS — I10 Essential (primary) hypertension: Secondary | ICD-10-CM

## 2019-12-14 NOTE — Progress Notes (Signed)
VIRTUAL VISIT Due to national recommendations of social distancing due to COVID 19, a virtual visit is felt to be most appropriate for this patient at this time.   I connected with the patient on 12/14/19 at  2:00 PM EST by virtual telehealth platform and verified that I am speaking with the correct person using two identifiers.   I discussed the limitations, risks, security and privacy concerns of performing an evaluation and management service by  virtual telehealth platform and the availability of in person appointments. I also discussed with the patient that there may be a patient responsible charge related to this service. The patient expressed understanding and agreed to proceed.  Patient location: Home Provider Location: [facility] Participants: Kerby Nora and Fredric Mare   Chief Complaint  Patient presents with  . Follow-up    GAD    History of Present Illness:   49 year old presents for follow up GAD.  She is  Currently on lexapro 20 mg daily , ambein at bedtime for insomia.  She uses alprazolam 1 tabs every other day prn anxiety.  GAD 7 : Generalized Anxiety Score 12/14/2019 03/02/2019 12/08/2018 03/24/2018  Nervous, Anxious, on Edge 1 1 1 1   Control/stop worrying 1 1 1 1   Worry too much - different things 1 1 1 1   Trouble relaxing 0 1 1 1   Restless 0 1 1 0  Easily annoyed or irritable 1 0 1 1  Afraid - awful might happen 1 1 2 1   Total GAD 7 Score 5 6 8 6   Anxiety Difficulty Somewhat difficult Somewhat difficult - -     Office Visit from 12/08/2018 in [facility] Total Score  0      PDMP review unremarkable 12/14/19   COVID 19 screen No recent travel or known exposure to COVID19 The patient denies respiratory symptoms of COVID 19 at this time.  The importance of social distancing was discussed today.   Review of Systems  Constitutional: Negative for chills and fever.  HENT: Negative for congestion and ear pain.    Eyes: Negative for pain and redness.  Respiratory: Negative for cough and shortness of breath.   Cardiovascular: Negative for chest pain, palpitations and leg swelling.  Gastrointestinal: Negative for abdominal pain, blood in stool, constipation, diarrhea, nausea and vomiting.  Genitourinary: Negative for dysuria.  Musculoskeletal: Negative for falls and myalgias.  Skin: Negative for rash.  Neurological: Negative for dizziness.  Psychiatric/Behavioral: Negative for depression. The patient is not nervous/anxious.       Past Medical History:  Diagnosis Date  . Depression     reports that she has quit smoking. Her smoking use included cigarettes. She has a 20.00 pack-year smoking history. She has never used smokeless tobacco. She reports that she does not drink alcohol or use drugs.   Current Outpatient Medications:  .  ALPRAZolam (XANAX) 0.25 MG tablet, TAKE 1 TO 2 TABLETS BY MOUTH TWICE DAILY, Disp: 90 tablet, Rfl: 0 .  aspirin EC 81 MG EC tablet, Take 1 tablet (81 mg total) by mouth daily., Disp: 30 tablet, Rfl: 0 .  atorvastatin (LIPITOR) 40 MG tablet, Take 1 tablet (40 mg total) by mouth daily., Disp: 90 tablet, Rfl: 3 .  carbamazepine (TEGRETOL XR) 100 MG 12 hr tablet, Take 200 mg by mouth 2 (two) times daily. , Disp: , Rfl:  .  escitalopram (LEXAPRO) 20 MG tablet, Take 1 tablet by mouth once daily, Disp: 30 tablet,  Rfl: 0 .  fluticasone (FLONASE) 50 MCG/ACT nasal spray, USE TWO SPRAY(S) IN EACH NOSTRIL ONCE DAILY, Disp: 16 g, Rfl: 11 .  gabapentin (NEURONTIN) 300 MG capsule, TAKE 2 CAPSULES BY MOUTH THREE TIMES DAILY AS NEEDED, Disp: , Rfl:  .  Multiple Vitamin (MULTIVITAMIN) tablet, Take 1 tablet by mouth daily. , Disp: , Rfl:  .  nortriptyline (PAMELOR) 10 MG capsule, Take 10-20 mg by mouth daily as needed for sleep., Disp: , Rfl:  .  valsartan (DIOVAN) 80 MG tablet, Take 1 tablet by mouth once daily, Disp: 90 tablet, Rfl: 1 .  zolpidem (AMBIEN) 10 MG tablet, TAKE 1 TABLET BY  MOUTH AT BEDTIME AS NEEDED FOR SLEEP, Disp: 30 tablet, Rfl: 0   Observations/Objective: Height 5' 7.5" (1.715 m).  Physical Exam  Physical Exam Constitutional:      General: The patient is not in acute distress. Pulmonary:     Effort: Pulmonary effort is normal. No respiratory distress.  Neurological:     Mental Status: The patient is alert and oriented to person, place, and time.  Psychiatric:        Mood and Affect: Mood normal.        Behavior: Behavior normal.   Assessment and Plan Generalized anxiety disorder  No red flags or misuse of medications.. Stable control.  Continue current dose of lexapro, decrease Ambien and alprazolam as able .    HTN (hypertension) Stable control on valsartan.     I discussed the assessment and treatment plan with the patient. The patient was provided an opportunity to ask questions and all were answered. The patient agreed with the plan and demonstrated an understanding of the instructions.   The patient was advised to call back or seek an in-person evaluation if the symptoms worsen or if the condition fails to improve as anticipated.     Eliezer Lofts, MD

## 2019-12-14 NOTE — Assessment & Plan Note (Signed)
Stable control on valsartan.

## 2019-12-14 NOTE — Assessment & Plan Note (Signed)
No red flags or misuse of medications.. Stable control.  Continue current dose of lexapro, decrease Ambien and alprazolam as able .

## 2019-12-15 ENCOUNTER — Other Ambulatory Visit: Payer: Self-pay | Admitting: Family Medicine

## 2019-12-24 ENCOUNTER — Other Ambulatory Visit: Payer: Self-pay | Admitting: Family Medicine

## 2019-12-25 NOTE — Telephone Encounter (Signed)
Last office visit 02/29/2021 for GAD/HTN.  Last refilled 11/22/2019 for #30 with no refills.  No future appointments.

## 2020-01-22 ENCOUNTER — Other Ambulatory Visit: Payer: Self-pay | Admitting: Family Medicine

## 2020-01-23 ENCOUNTER — Other Ambulatory Visit: Payer: Self-pay

## 2020-01-23 NOTE — Telephone Encounter (Signed)
  Last Fill or Written Date and Quantity: 01/25/20. #30 Last Office Visit and Type: 12/14/19 f/u Next Office Visit and Type: None Last Controlled Substance Agreement Date: 04/24/15 Last UDS:04/04/17

## 2020-03-06 DIAGNOSIS — Z9884 Bariatric surgery status: Secondary | ICD-10-CM | POA: Insufficient documentation

## 2020-03-06 DIAGNOSIS — K449 Diaphragmatic hernia without obstruction or gangrene: Secondary | ICD-10-CM | POA: Insufficient documentation

## 2020-03-26 ENCOUNTER — Other Ambulatory Visit: Payer: Self-pay | Admitting: Family Medicine

## 2020-03-26 NOTE — Telephone Encounter (Signed)
Please schedule CPE with fasting labs for Dr. Bedsole. 

## 2020-04-08 ENCOUNTER — Encounter: Payer: Self-pay | Admitting: Nurse Practitioner

## 2020-04-08 ENCOUNTER — Ambulatory Visit: Payer: Managed Care, Other (non HMO) | Admitting: Nurse Practitioner

## 2020-04-08 ENCOUNTER — Other Ambulatory Visit: Payer: Self-pay

## 2020-04-08 VITALS — BP 128/70 | HR 99 | Resp 15 | Ht 68.0 in | Wt 231.0 lb

## 2020-04-08 DIAGNOSIS — H1031 Unspecified acute conjunctivitis, right eye: Secondary | ICD-10-CM

## 2020-04-08 DIAGNOSIS — R9089 Other abnormal findings on diagnostic imaging of central nervous system: Secondary | ICD-10-CM | POA: Insufficient documentation

## 2020-04-08 MED ORDER — TOBRAMYCIN 0.3 % OP SOLN: 2.0000 [drp] | OPHTHALMIC | 0 refills | Status: AC

## 2020-04-08 NOTE — Patient Instructions (Addendum)
Can return to work 24 hours after antibiotic use Thorough hand hygiene encouraged Throw away any eye makeup  Encouraged patient to call the office or primary care doctor for an appointment if no improvement in symptoms or if symptoms change or worsen after 72 hours of planned treatment. Patient verbalized understanding of all instructions given/reviewed and has no further questions or concerns at this time.      Bacterial Conjunctivitis, Adult Bacterial conjunctivitis is an infection of your conjunctiva. This is the clear membrane that covers the white part of your eye and the inner part of your eyelid. This infection can make your eye:  Red or pink.  Itchy. This condition spreads easily from person to person (is contagious) and from one eye to the other eye. What are the causes?  This condition is caused by germs (bacteria). You may get the infection if you come into close contact with: ? A person who has the infection. ? Items that have germs on them (are contaminated), such as face towels, contact lens solution, or eye makeup. What increases the risk? You are more likely to get this condition if you:  Have contact with people who have the infection.  Wear contact lenses.  Have a sinus infection.  Have had a recent eye injury or surgery.  Have a weak body defense system (immune system).  Have dry eyes. What are the signs or symptoms?    Thick, yellowish discharge from the eye.  Tearing or watery eyes.  Itchy eyes.  Burning feeling in your eyes.  Eye redness.  Swollen eyelids.  Blurred vision. How is this treated?    Antibiotic eye drops or ointment.  Antibiotic medicine taken by mouth. This is used for infections that do not get better with drops or ointment or that last more than 10 days.  Cool, wet cloths placed on the eyes.  Artificial tears used 2-6 times a day. Follow these instructions at home: Medicines  Take or apply your antibiotic medicine  as told by your doctor. Do not stop taking or applying the antibiotic even if you start to feel better.  Take or apply over-the-counter and prescription medicines only as told by your doctor.  Do not touch your eyelid with the eye-drop bottle or the ointment tube. Managing discomfort  Wipe any fluid from your eye with a warm, wet washcloth or a cotton ball.  Place a clean, cool, wet cloth on your eye. Do this for 10-20 minutes, 3-4 times per day. General instructions  Do not wear contacts until the infection is gone. Wear glasses until your doctor says it is okay to wear contacts again.  Do not wear eye makeup until the infection is gone. Throw away old eye makeup.  Change or wash your pillowcase every day.  Do not share towels or washcloths.  Wash your hands often with soap and water. Use paper towels to dry your hands.  Do not touch or rub your eyes.  Do not drive or use heavy machinery if your vision is blurred. Contact a doctor if:  You have a fever.  You do not get better after 10 days. Get help right away if:  You have a fever and your symptoms get worse all of a sudden.  You have very bad pain when you move your eye.  Your face: ? Hurts. ? Is red. ? Is swollen.  You have sudden loss of vision. Summary  Bacterial conjunctivitis is an infection of your conjunctiva.  This infection  spreads easily from person to person.  Wash your hands often with soap and water. Use paper towels to dry your hands.  Take or apply your antibiotic medicine as told by your doctor.  Contact a doctor if you have a fever or you do not get better after 10 days. This information is not intended to replace advice given to you by your health care provider. Make sure you discuss any questions you have with your health care provider. Document Revised: 01/30/2019 Document Reviewed: 05/17/2018 Elsevier Patient Education  [place].

## 2020-04-08 NOTE — Progress Notes (Signed)
   Subjective:    Patient ID: Laura Gordon, female    DOB: 11-16-1970, 49 y.o.   MRN: [id number]  HPI Jelisha is here today with c/o left eye redness that started 2 days ago. She reports that she wore her contacts 3 days ago and had permanent eyeliner done. She work up yesterday with a red right eye and today she reports itching, greenish d/c, matted and crustations when she woke up. 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 She's thrown away her contacts. Denies any fever, rashes, changes in vision or other concerns. She denies feeling as if she's scratched her eye or injured it.    Review of Systems  Constitutional: Negative for fever.  Eyes: Positive for discharge, redness and itching. Negative for pain and visual disturbance.       Eye matting and crustations this am  Skin: Negative for rash.       Objective:   Physical Exam Constitutional:      General: She is not in acute distress.    Appearance: Normal appearance. She is not ill-appearing, toxic-appearing or diaphoretic.  HENT:     Head: Normocephalic and atraumatic.  Eyes:     General:        Right eye: Discharge present.     Extraocular Movements: Extraocular movements intact.     Pupils: Pupils are equal, round, and reactive to light.     Comments: Entire sclera with marked erythema and greenish/yellow discharge sitting at the inner canthus. Tolerated eye exam and some edema noted to right lower lid.   Pulmonary:     Effort: Pulmonary effort is normal.  Musculoskeletal:     Cervical back: Normal range of motion and neck supple.  Skin:    General: Skin is warm and dry.  Neurological:     Mental Status: She is alert and oriented to person, place, and time.  Psychiatric:        Mood and Affect: Mood normal.        Behavior: Behavior normal.           Assessment & Plan:

## 2020-04-09 NOTE — Telephone Encounter (Signed)
lvm asking pt to call office °

## 2020-06-09 ENCOUNTER — Encounter: Payer: Self-pay | Admitting: Family Medicine

## 2020-06-09 NOTE — Telephone Encounter (Signed)
Attempted to reach pt again. Unable to contact. Mailed letter.

## 2020-07-14 ENCOUNTER — Encounter: Payer: Self-pay | Admitting: *Deleted

## 2020-07-17 DIAGNOSIS — I10 Essential (primary) hypertension: Secondary | ICD-10-CM | POA: Insufficient documentation

## 2020-12-02 ENCOUNTER — Other Ambulatory Visit: Payer: Self-pay | Admitting: Family Medicine

## 2020-12-02 DIAGNOSIS — Z1231 Encounter for screening mammogram for malignant neoplasm of breast: Secondary | ICD-10-CM

## 2020-12-31 ENCOUNTER — Other Ambulatory Visit: Payer: Self-pay

## 2020-12-31 ENCOUNTER — Ambulatory Visit
Admission: RE | Admit: 2020-12-31 | Discharge: 2020-12-31 | Disposition: A | Discharge status: Home / Self Care | Payer: Managed Care, Other (non HMO) | Source: Ambulatory Visit | Attending: Family Medicine | Admitting: Family Medicine

## 2020-12-31 DIAGNOSIS — Z1231 Encounter for screening mammogram for malignant neoplasm of breast: Secondary | ICD-10-CM | POA: Insufficient documentation

## 2021-02-09 IMAGING — MG DIGITAL SCREENING BILATERAL MAMMOGRAM WITH TOMO AND CAD
6 of 10 series · 6 of 30 positions shown · non-contrast
Comparison: Previous exam(s).

CLINICAL DATA: Screening.

EXAM:
DIGITAL SCREENING BILATERAL MAMMOGRAM WITH TOMO AND CAD

[L CC synth-2D]
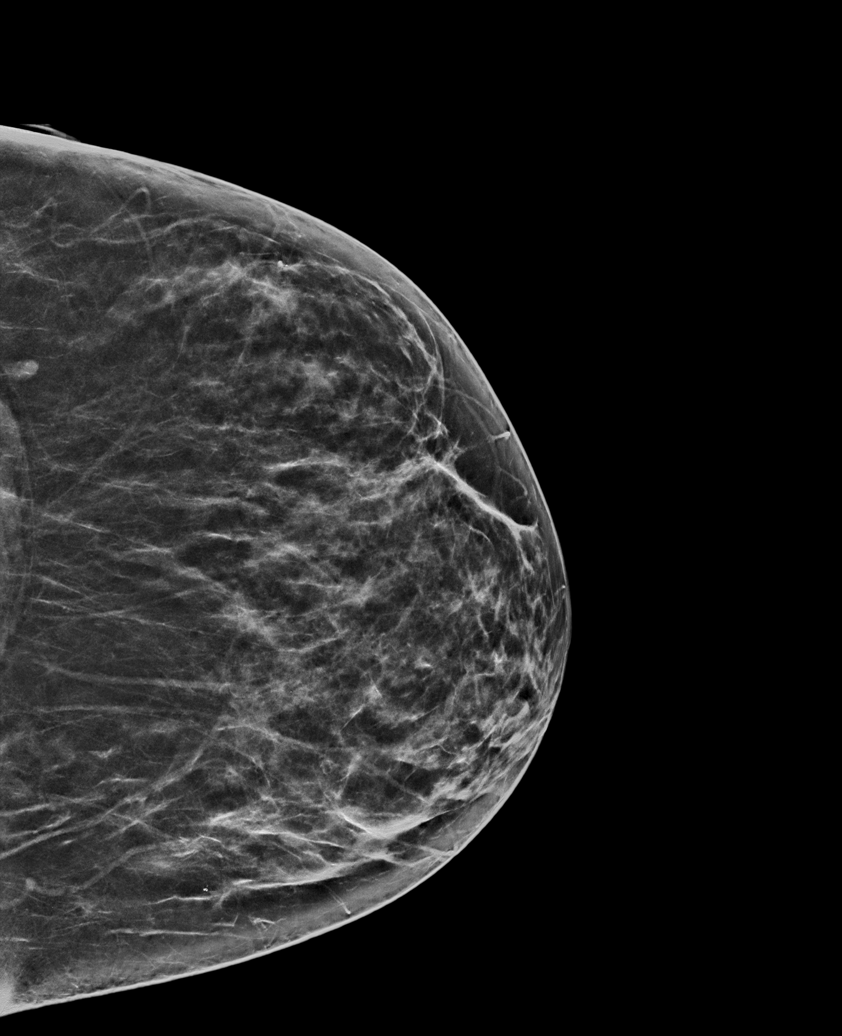

[R CC synth-2D]
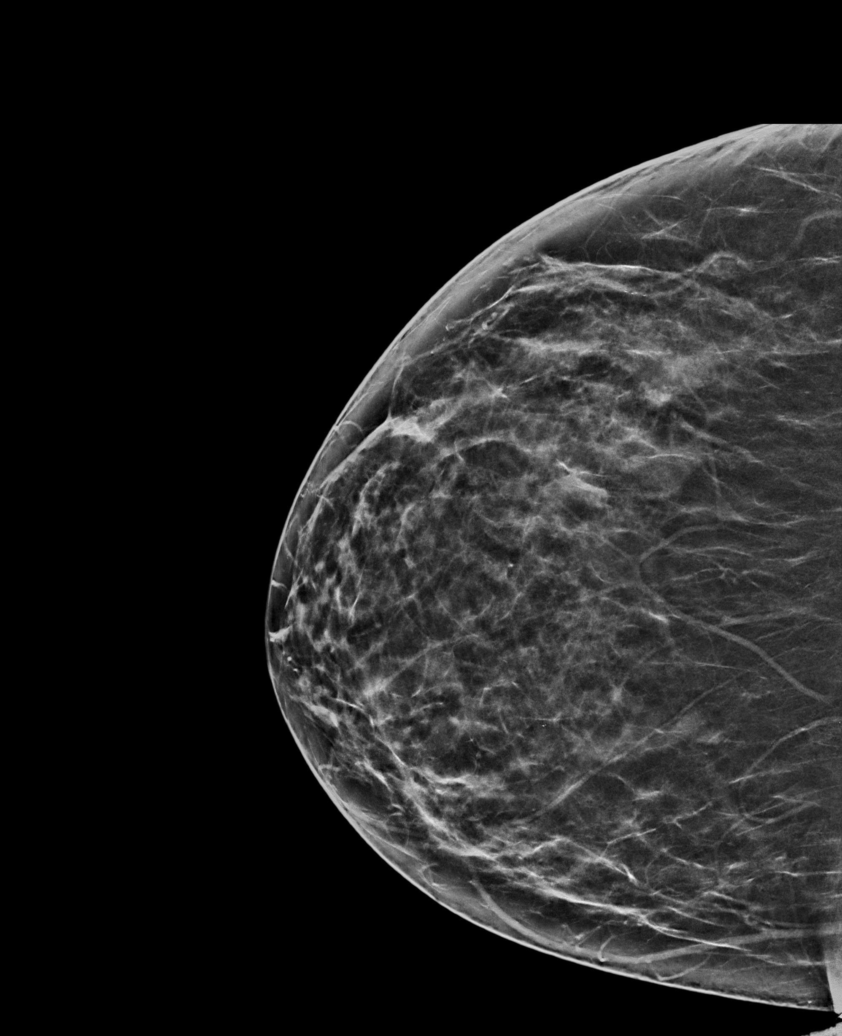

[R MLO synth-2D]
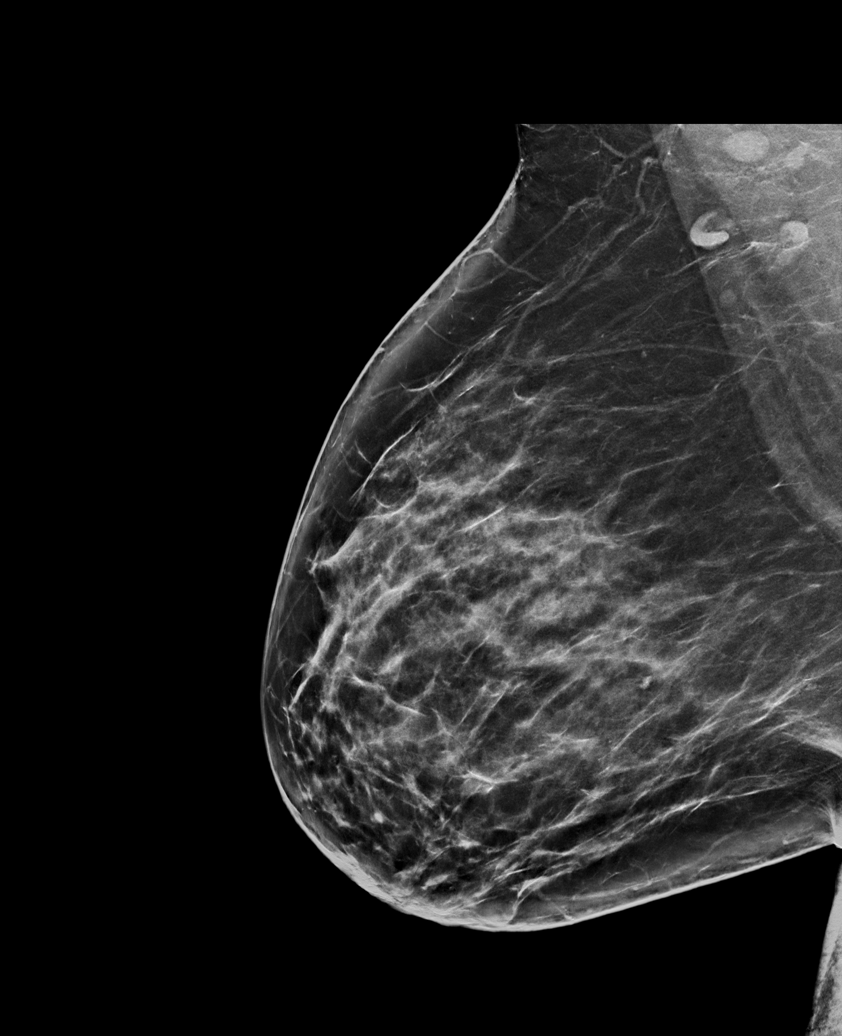

[L MLO synth-2D (1 of 2)]
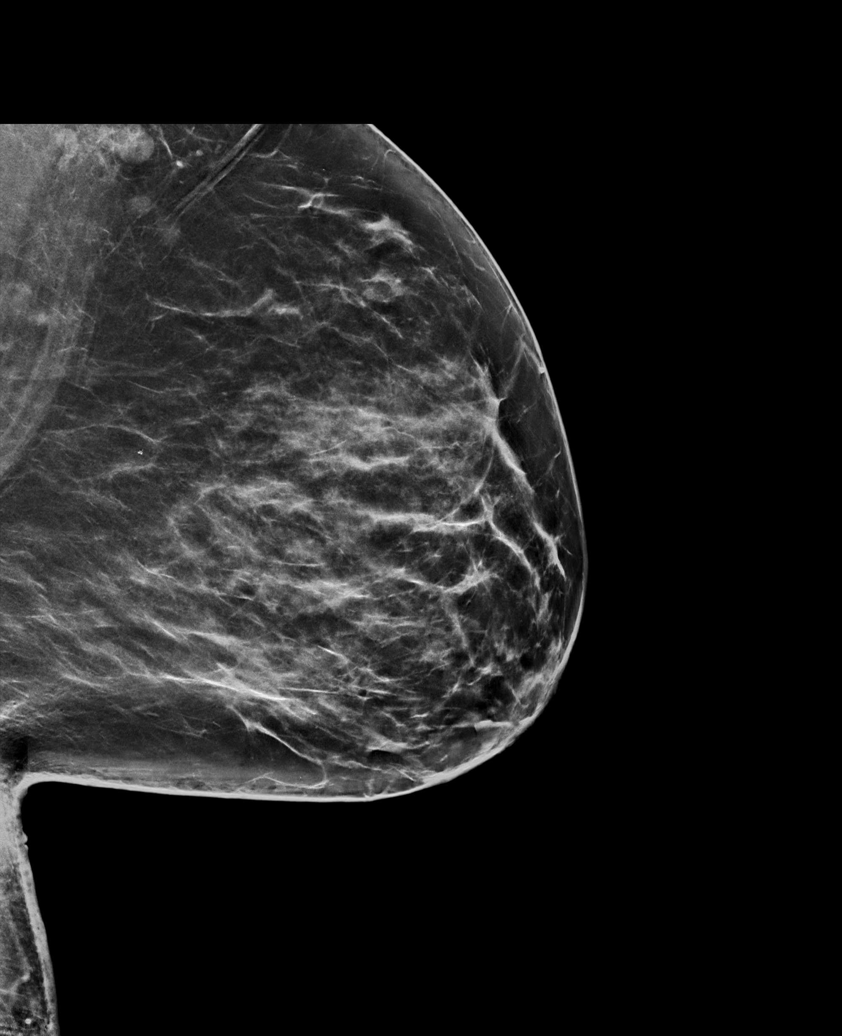

[L MLO synth-2D (2 of 2)]
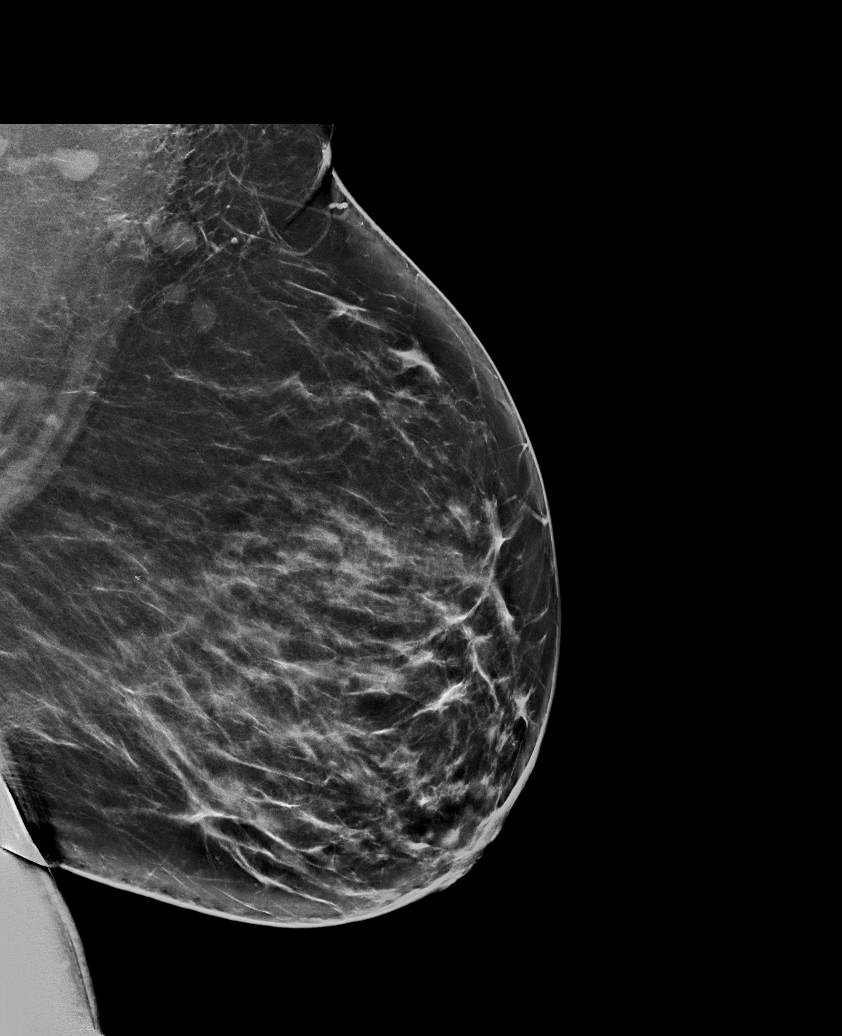

[R CC tomo · tomo slice 35/68.0]
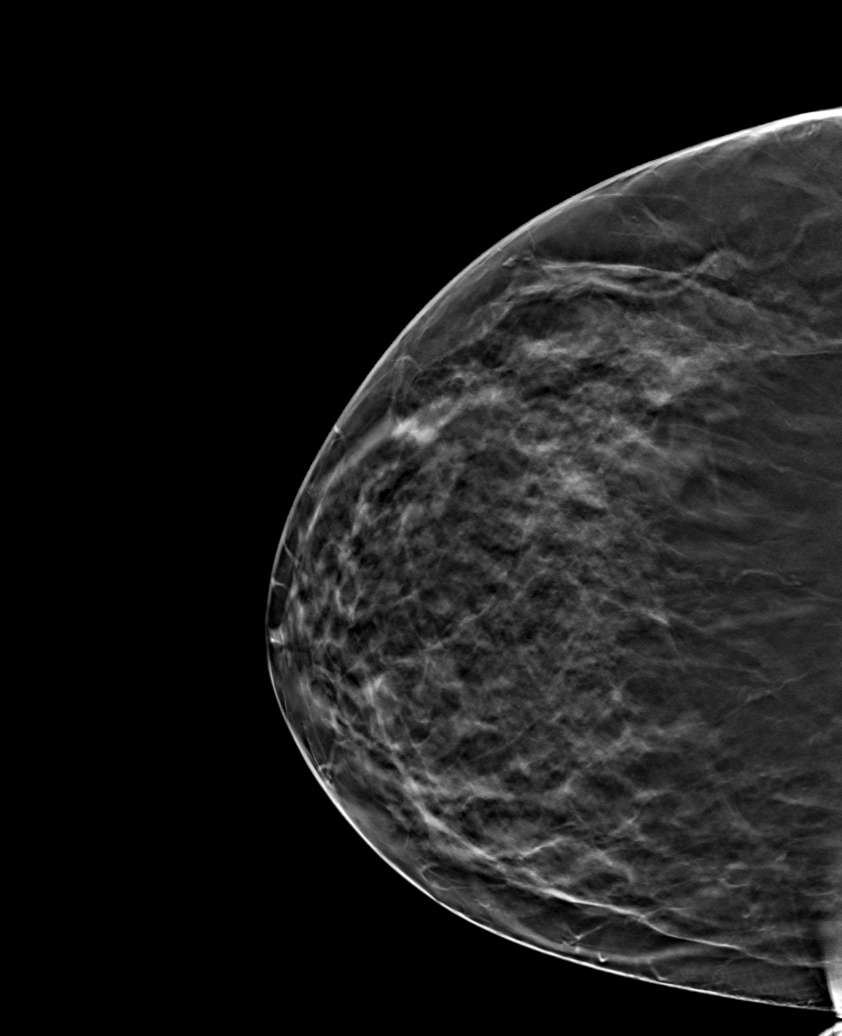

[6 of 30 positions shown; findings below may reference images not displayed]

ACR Breast Density Category c: The breast tissue is heterogeneously
dense, which may obscure small masses.
FINDINGS: There are no findings suspicious for malignancy. Images were
processed with CAD.
IMPRESSION: No mammographic evidence of malignancy. A result letter of this
screening mammogram will be mailed directly to the patient.

RECOMMENDATION:
Screening mammogram in one year. (Code:[id number])

BI-RADS CATEGORY  1: Negative.

## 2021-02-19 ENCOUNTER — Encounter: Payer: Self-pay | Admitting: Physician Assistant

## 2021-02-19 ENCOUNTER — Ambulatory Visit: Payer: Managed Care, Other (non HMO) | Admitting: Physician Assistant

## 2021-02-19 VITALS — BP 117/73 | HR 106 | Temp 97.8°F | Resp 18 | Wt 157.0 lb

## 2021-02-19 DIAGNOSIS — L293 Anogenital pruritus, unspecified: Secondary | ICD-10-CM | POA: Diagnosis not present

## 2021-02-19 DIAGNOSIS — R350 Frequency of micturition: Secondary | ICD-10-CM

## 2021-02-19 DIAGNOSIS — R319 Hematuria, unspecified: Secondary | ICD-10-CM

## 2021-02-19 DIAGNOSIS — R300 Dysuria: Secondary | ICD-10-CM

## 2021-02-19 LAB — POCT URINALYSIS DIPSTICK
Bilirubin, UA: NEGATIVE
Glucose, UA: NEGATIVE
Ketones, UA: NEGATIVE
Nitrite, UA: NEGATIVE
Protein, UA: NEGATIVE
Spec Grav, UA: 1.015 (ref 1.010–1.025)
Urobilinogen, UA: NEGATIVE E.U./dL — AB
pH, UA: 7.5 (ref 5.0–8.0)

## 2021-02-19 NOTE — Progress Notes (Addendum)
   Subjective:    Patient ID: Laura Gordon, female    DOB: 11-18-1970, 50 y.o.   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  Recent intentional weight loss from 231 to 157 Had Sadi  Procedure after laparoscopic sleeve ( 2013/14) not adequately  successful Frequent bowel movements s/p resection- questions possible contamination Denies vaginal discharge  Denies change partners Hx of frequent UTIs in previous habitus  Recognizes inadequate water intake. Drinks sodas. Possible mild dehydration because working in clinic today  PCP Kerby Nora, MD  Review of Systems Covid Vaccines + Post Menopausal, Ablation Former smoker    Objective:   Physical Exam - this clinic is not equipped to do pelvic exams, pt aware. VSS , pulse slightly up from hurrying suspected   U/a clean catch : blood 2 +, Leukocytes 2+, mod; pH 7.5, nitrates neg Submit for microscopic and culture   Limited visit and discussion because patient stated she had to rush back to her clinic for patient care.     Assessment & Plan:  Cool clear perineal wash- avoid harsh soaps Avoid scrubbing  Check peripads and D/C if plastic covering present Use cotton liners , unscented No perfumes or bubble stuff to bathwater  Contact PCP for exam tomorrow- avoid any medications if appointment scheduled  if unable to be seen- use a 3 days RX OTC for yeast (if cream well tolerated now).to get through the weekend.  Careful perineal care- use warm hairdryer not towel scrub Suspect razor burn and chemical irritation Actively increase water intake until voiding every 2 hours  Call tomorrow for prelim lab impression- Review and  recommnedation  Dysuria  Frequency  Perineal itching  Addendum :  Lab reports returned with urinary tract infection- contacted patient- She had been able to see PCP this morning and have perineal exam ( still very irritated but slightly better). PCP  Rx Cephalexin 500mg   BID x 7 days pending results of C&S....discussed with patient. Encourage increased hydration,avoid sodas,frequent emptying. To call office Monday AM for C&S results

## 2021-02-20 LAB — URINALYSIS, ROUTINE W REFLEX MICROSCOPIC
Bilirubin, UA: NEGATIVE
Glucose, UA: NEGATIVE
Ketones, UA: NEGATIVE
Nitrite, UA: POSITIVE — AB
Specific Gravity, UA: 1.016 (ref 1.005–1.030)
Urobilinogen, Ur: 0.2 mg/dL (ref 0.2–1.0)
pH, UA: 5.5 (ref 5.0–7.5)

## 2021-02-20 LAB — MICROSCOPIC EXAMINATION
Casts: NONE SEEN /lpf
WBC, UA: >30 /hpf — AB (ref 0–5)

## 2021-02-24 LAB — URINE CULTURE

## 2021-05-11 ENCOUNTER — Other Ambulatory Visit: Payer: Self-pay

## 2021-05-11 ENCOUNTER — Ambulatory Visit
Admission: EM | Admit: 2021-05-11 | Discharge: 2021-05-11 | Disposition: A | Discharge status: Home / Self Care | Payer: Managed Care, Other (non HMO) | Attending: Physician Assistant | Admitting: Physician Assistant

## 2021-05-11 ENCOUNTER — Encounter: Payer: Self-pay | Admitting: Emergency Medicine

## 2021-05-11 DIAGNOSIS — Z20822 Contact with and (suspected) exposure to covid-19: Secondary | ICD-10-CM | POA: Diagnosis present

## 2021-05-11 DIAGNOSIS — N3001 Acute cystitis with hematuria: Secondary | ICD-10-CM | POA: Diagnosis not present

## 2021-05-11 LAB — URINALYSIS, COMPLETE (UACMP) WITH MICROSCOPIC
Bilirubin Urine: NEGATIVE
Glucose, UA: NEGATIVE mg/dL
Ketones, ur: NEGATIVE mg/dL
Nitrite: NEGATIVE
Protein, ur: NEGATIVE mg/dL
Specific Gravity, Urine: 1.01 (ref 1.005–1.030)
pH: 5.5 (ref 5.0–8.0)

## 2021-05-11 LAB — SARS CORONAVIRUS 2 (TAT 6-24 HRS): SARS Coronavirus 2: NEGATIVE

## 2021-05-11 MED ORDER — CIPROFLOXACIN HCL 500 MG PO TABS: 500.0000 mg | ORAL_TABLET | Freq: Two times a day (BID) | ORAL | 0 refills | Status: DC

## 2021-05-11 NOTE — ED Provider Notes (Signed)
[facility]    CSN: [id number] Arrival date & time: 05/11/21  1046      History   Chief Complaint Chief Complaint  Patient presents with   Fever    HPI Laura Gordon is a 50 y.o. female who presents with onset of fever up to 103 last night, body aches. Has been having urinary frequency, but denies URI symptoms. Had neg at home Covid test yesterday. Her last UTI was April this year.  Denies blood in her urine or hx of renal stones.  Has had covid in the past and has had 4 Moderna covid  injections.    Past Medical History:  Diagnosis Date   Depression     Patient Active Problem List   Diagnosis Date Noted   Trigeminal neuralgia 05/08/2018   Vagina, candidiasis 03/24/2018   Class 1 obesity without serious comorbidity with body mass index (BMI) of 33.0 to 33.9 in adult 09/06/2017   External hemorrhoid 07/27/2016   History of TIA (transient ischemic attack) 06/22/2015   High cholesterol 07/10/2013   Allergic rhinitis 06/06/2013   Frequent UTI 06/06/2013   Hx of diabetes mellitus 06/06/2013   HTN (hypertension) 06/06/2013   Generalized anxiety disorder 06/06/2013   Chronic insomnia 06/06/2013    Past Surgical History:  Procedure Laterality Date   CESAREAN SECTION     CESAREAN SECTION N/A    ENDOMETRIAL ABLATION  10/25/2004   LAPAROSCOPIC GASTRIC SLEEVE RESECTION  08/2012   TUBAL LIGATION      OB History   No obstetric history on file.      Home Medications    Prior to Admission medications   Medication Sig Start Date End Date Taking? Authorizing Provider  ALPRAZolam (XANAX) 0.25 MG tablet TAKE 1 TO 2 TABLETS BY MOUTH TWICE DAILY 12/04/19  Yes Bedsole, Amy E, MD  aspirin EC 81 MG EC tablet Take 1 tablet (81 mg total) by mouth daily. 06/22/15  Yes Katha Hamming, MD  Biotin 1000 MCG tablet Take 1,000 mcg by mouth 3 (three) times daily.   Yes [provider]  ciprofloxacin (CIPRO) 500 MG tablet Take 1 tablet (500 mg total) by mouth 2  (two) times daily. 05/11/21  Yes Rodriguez-Southworth, Nettie Elm, PA-C  escitalopram (LEXAPRO) 20 MG tablet Take 1 tablet by mouth once daily 12/25/19  Yes Bedsole, Amy E, MD  fluticasone (FLONASE) 50 MCG/ACT nasal spray USE TWO SPRAY(S) IN EACH NOSTRIL ONCE DAILY 09/06/17  Yes Bedsole, Amy E, MD  folic acid (FOLVITE) 1 MG tablet Take 1 mg by mouth daily.   Yes [provider]  Multiple Vitamin (MULTIVITAMIN) tablet Take 1 tablet by mouth daily.   Yes [provider]  zolpidem (AMBIEN) 10 MG tablet TAKE 1 TABLET BY MOUTH AT BEDTIME AS NEEDED FOR SLEEP 01/24/20  Yes Bedsole, Amy E, MD  carbamazepine (TEGRETOL XR) 100 MG 12 hr tablet Take 200 mg by mouth 2 (two) times daily.  Patient not taking: Reported on 02/19/2021 07/25/18   [provider]    Family History Family History  Problem Relation Age of Onset   Heart disease Mother 31   Diabetes Mother    Hypertension Mother    Hyperlipidemia Mother    Kidney disease Father 3   Heart disease Father    Diabetes Daughter 17       type 2    Diabetes Son 22       type 1   Diabetes Maternal Grandmother    Heart disease Paternal  Grandfather    Hyperlipidemia Paternal Grandfather    Hypertension Paternal Grandfather     Social History Social History   Tobacco Use   Smoking status: Former    Packs/day: 1.00    Years: 20.00    Pack years: 20.00    Types: Cigarettes   Smokeless tobacco: Never  Substance Use Topics   Alcohol use: No    Alcohol/week: 0.0 standard drinks   Drug use: No     Allergies   Patient has no known allergies.   Review of Systems Review of Systems  Constitutional:  Positive for chills and fever.  HENT:  Negative for congestion, rhinorrhea and sore throat.   Respiratory:  Negative for cough.   Gastrointestinal:  Negative for abdominal pain, diarrhea, nausea and vomiting.  Genitourinary:  Positive for frequency. Negative for dysuria, flank pain and hematuria.  Musculoskeletal:  Positive  for myalgias.  Skin:  Negative for rash.  Neurological:  Positive for headaches.    Physical Exam Triage Vital Signs ED Triage Vitals  Enc Vitals Group     BP 05/11/21 1106 (!) 141/89     Pulse Rate 05/11/21 1106 85     Resp 05/11/21 1106 18     Temp 05/11/21 1106 99 F (37.2 C)     Temp Source 05/11/21 1106 Oral     SpO2 05/11/21 1106 99 %     Weight 05/11/21 1107 150 lb (68 kg)     Height 05/11/21 1107 5\' 8"  (1.727 m)     Head Circumference --      Peak Flow --      Pain Score 05/11/21 1106 0     Pain Loc --      Pain Edu? --      Excl. in GC? --    No data found.  Updated Vital Signs BP (!) 141/89 (BP Location: Right Arm)   Pulse 85   Temp 99 F (37.2 C) (Oral)   Resp 18   Ht 5\' 8"  (1.727 m)   Wt 150 lb (68 kg)   SpO2 99%   BMI 22.81 kg/m   Visual Acuity Right Eye Distance:   Left Eye Distance:   Bilateral Distance:    Right Eye Near:   Left Eye Near:    Bilateral Near:     Physical Exam Physical Exam Physical Exam Vitals and nursing note reviewed.  Constitutional:      General: She is not in acute distress.    Appearance: She is not toxic-appearing.  HENT:     Head: Normocephalic.     Right Ear: External ear normal.     Left Ear: External ear normal.  Eyes:     General: No scleral icterus.    Conjunctiva/sclera: Conjunctivae normal.  Pulmonary:     Effort: Pulmonary effort is normal. Lungs are clear HEART- RRR with no murmur Abdominal:     General: Bowel sounds are normal.     Palpations: Abdomen is soft. There is no mass.     Tenderness: There is no guarding or rebound. Has mild tendeerness on R mid abdomen    Comments: - CVA tenderness   Musculoskeletal:        General: Normal range of motion.     Cervical back: Neck supple.      Skin:    General: Skin is warm and dry.     Findings: No rash.  Neurological:     Mental Status: She is alert and oriented to person,  place, and time.     Gait: Gait normal.  Psychiatric:        Mood and  Affect: Mood normal.        Behavior: Behavior normal.        Thought Content: Thought content normal.        Judgment: Judgment normal.    UC Treatments / Results  Labs (all labs ordered are listed, but only abnormal results are displayed) Labs Reviewed  URINALYSIS, COMPLETE (UACMP) WITH MICROSCOPIC - Abnormal; Notable for the following components:      Result Value   APPearance HAZY (*)    Hgb urine dipstick MODERATE (*)    Leukocytes,Ua SMALL (*)    Bacteria, UA MANY (*)    All other components within normal limits  SARS CORONAVIRUS 2 (TAT 6-24 HRS)  URINE CULTURE    EKG   Radiology No results found.  Procedures Procedures (including critical care time)  Medications Ordered in UC Medications - No data to display  Initial Impression / Assessment and Plan / UC Course  I have reviewed the triage vital signs and the nursing notes. Pertinent labs results that were available during my care of the patient were reviewed by me and considered in my medical decision making (see chart for details). Possibly early Pyelo. I sent her urine for a culture and in the mean time placed her on Cipro. Medication precautions reviewed with her.  Covid test is pending and we will call her if positive.     Final Clinical Impressions(s) / UC Diagnoses   Final diagnoses:  Suspected COVID-19 virus infection  Acute cystitis with hematuria     Discharge Instructions      We will call you if your covid test is positive.       ED Prescriptions     Medication Sig Dispense Auth. Provider   ciprofloxacin (CIPRO) 500 MG tablet Take 1 tablet (500 mg total) by mouth 2 (two) times daily. 14 tablet Rodriguez-Southworth, Nettie Elm, PA-C      PDMP not reviewed this encounter.   Garey Ham, PA-C 05/11/21 1206

## 2021-05-11 NOTE — ED Triage Notes (Signed)
Pt c/o fever (103), chills, body aches. Started last night. Pt denies any URI symptoms. She does state she has had urinary frequency. Pt state she took a home covid test and was negative.

## 2021-05-11 NOTE — Discharge Instructions (Addendum)
We will call you if your covid test is positive.

## 2021-05-13 LAB — URINE CULTURE: Culture: 10000 — AB

## 2022-02-23 ENCOUNTER — Encounter: Payer: Self-pay | Admitting: Physician Assistant

## 2022-02-23 ENCOUNTER — Telehealth: Payer: Managed Care, Other (non HMO) | Admitting: Physician Assistant

## 2022-02-23 ENCOUNTER — Ambulatory Visit
Admission: EM | Admit: 2022-02-23 | Discharge: 2022-02-23 | Disposition: A | Discharge status: Home / Self Care | Payer: Managed Care, Other (non HMO) | Attending: Family | Admitting: Family

## 2022-02-23 DIAGNOSIS — R109 Unspecified abdominal pain: Secondary | ICD-10-CM | POA: Diagnosis not present

## 2022-02-23 DIAGNOSIS — R509 Fever, unspecified: Secondary | ICD-10-CM | POA: Insufficient documentation

## 2022-02-23 DIAGNOSIS — R350 Frequency of micturition: Secondary | ICD-10-CM | POA: Insufficient documentation

## 2022-02-23 LAB — URINALYSIS, ROUTINE W REFLEX MICROSCOPIC
Bilirubin Urine: NEGATIVE
Glucose, UA: NEGATIVE mg/dL
Ketones, ur: NEGATIVE mg/dL
Nitrite: POSITIVE — AB
Protein, ur: >300 mg/dL — AB
Specific Gravity, Urine: 1.02 (ref 1.005–1.030)
pH: 5.5 (ref 5.0–8.0)

## 2022-02-23 LAB — URINALYSIS, MICROSCOPIC (REFLEX): WBC, UA: >50 WBC/hpf (ref 0–5)

## 2022-02-23 MED ORDER — CEPHALEXIN 500 MG PO CAPS: 500.0000 mg | ORAL_CAPSULE | Freq: Two times a day (BID) | ORAL | 0 refills | Status: AC

## 2022-02-23 NOTE — ED Triage Notes (Signed)
Patient presents to Urgent Care with complaints of flank pain since today and fever x 2 days. Concerned with UTI. Treating symptoms with motrin.  ?

## 2022-02-23 NOTE — Patient Instructions (Signed)
?  Fredric Mare, thank you for joining Karrie Meres, PA-C for today's virtual visit.  While this provider is not your primary care provider (PCP), if your PCP is located in our provider database this encounter information will be shared with them immediately following your visit. ? ?Consent: ?(Patient) Laura Gordon provided verbal consent for this virtual visit at the beginning of the encounter. ? ?Current Medications: ? ?Current Outpatient Medications:  ?  ALPRAZolam (XANAX) 0.25 MG tablet, TAKE 1 TO 2 TABLETS BY MOUTH TWICE DAILY, Disp: 90 tablet, Rfl: 0 ?  aspirin EC 81 MG EC tablet, Take 1 tablet (81 mg total) by mouth daily., Disp: 30 tablet, Rfl: 0 ?  Biotin 1000 MCG tablet, Take 1,000 mcg by mouth 3 (three) times daily., Disp: , Rfl:  ?  carbamazepine (TEGRETOL XR) 100 MG 12 hr tablet, Take 200 mg by mouth 2 (two) times daily.  (Patient not taking: Reported on 02/19/2021), Disp: , Rfl:  ?  ciprofloxacin (CIPRO) 500 MG tablet, Take 1 tablet (500 mg total) by mouth 2 (two) times daily., Disp: 14 tablet, Rfl: 0 ?  escitalopram (LEXAPRO) 20 MG tablet, Take 1 tablet by mouth once daily, Disp: 30 tablet, Rfl: 5 ?  fluticasone (FLONASE) 50 MCG/ACT nasal spray, USE TWO SPRAY(S) IN EACH NOSTRIL ONCE DAILY, Disp: 16 g, Rfl: 11 ?  folic acid (FOLVITE) 1 MG tablet, Take 1 mg by mouth daily., Disp: , Rfl:  ?  Multiple Vitamin (MULTIVITAMIN) tablet, Take 1 tablet by mouth daily., Disp: , Rfl:  ?  zolpidem (AMBIEN) 10 MG tablet, TAKE 1 TABLET BY MOUTH AT BEDTIME AS NEEDED FOR SLEEP, Disp: 30 tablet, Rfl: 0  ? ?Medications ordered in this encounter:  ?No orders of the defined types were placed in this encounter. ?  ? ?*If you need refills on other medications prior to your next appointment, please contact your pharmacy* ? ?Follow-Up: ?Call back or seek an in-person evaluation if the symptoms worsen or if the condition fails to improve as anticipated. ? ?Other Instructions ? ?If you have been instructed to have an  in-person evaluation today at a local Urgent Care facility, please use the link below. It will take you to a list of all of our available [facility], including address, phone number and hours of operation. Please do not delay care.  ?[facility] ? ?If you or a family member do not have a primary care provider, use the link below to schedule a visit and establish care. When you choose a [facility] primary care physician or advanced practice provider, you gain a long-term partner in health. ?Find a Primary Care Provider ? ?Learn more about [facility]'s in-office and virtual care options: ?[facility] - Get Care Now ? ?

## 2022-02-23 NOTE — ED Triage Notes (Signed)
Pt reports having a video visit today and provider instructed her to come to UC to rule out pyelonephritis.  ?

## 2022-02-23 NOTE — Discharge Instructions (Addendum)
Recommend start Keflex 500mg  twice a day as directed. Continue to push fluids. Continue OTC Motrin 600mg  every 6 to 8 hours as needed for pain or fever. Follow-up in 48 hours if not improving and pending urine culture results.  ?

## 2022-02-23 NOTE — Progress Notes (Signed)
[name], Gordon are scheduled for a virtual visit with your provider today.   ? ?Just as we do with appointments in the office, we must obtain your consent to participate.  Your consent will be active for this visit and any virtual visit you may have with one of our providers in the next 365 days.   ? ?If you have a MyChart account, I can also send a copy of this consent to you electronically.  All virtual visits are billed to your insurance company just like a traditional visit in the office.  As this is a virtual visit, video technology does not allow for your provider to perform a traditional examination.  This may limit your provider's ability to fully assess your condition.  If your provider identifies any concerns that need to be evaluated in person or the need to arrange testing such as labs, EKG, etc, we will make arrangements to do so.   ? ?Although advances in technology are sophisticated, we cannot ensure that it will always work on either your end or our end.  If the connection with a video visit is poor, we may have to switch to a telephone visit.  With either a video or telephone visit, we are not always able to ensure that we have a secure connection.   I need to obtain your verbal consent now.   Are you willing to proceed with your visit today?  ? ?Laura Gordon has provided verbal consent on 02/23/2022 for a virtual visit (video or telephone). ? ? ?Karrie Meres, PA-C ?02/23/2022  5:39 PM ? ? ?Date:  02/23/2022  ? ?ID:  PARI LOMBARD, DOB 1971/09/26, MRN [id number] ? ?Patient Location: Home ?Provider Location: Home Office ? ? ?Participants: Patient and Provider for Visit and Wrap up ? ?Method of visit: Video  ?Location of Patient: Home ?Location of Provider: Home Office ?Consent was obtain for visit over the video. ?Services rendered by provider: Visit was performed via video ? ?A video enabled telemedicine application was used and I verified that I am speaking with the correct person using two  identifiers. ? ?PCP:  Excell Seltzer, MD  ? ?Chief Complaint:  fever ? ?History of Present Illness:   ? ?Laura Gordon is a 51 y.o. female with history as stated below. ?Presents video telehealth for an acute care visit ? ?Pt reports fevers that started yesterday. She took a home COVID test that was negative. She had another fever today. She reports dysuria, bilat flank pain as well. She denies nausea, vomiting. Denies cough, congestion, sob. ? ?Past Medical, Surgical, Social History, Allergies, and Medications have been Reviewed. ? ?Past Medical History:  ?Diagnosis Date  ? Depression   ? ? ?No outpatient medications have been marked as taking for the 02/23/22 encounter (Video Visit) with Gwynn Crossley S, PA-C.  ?  ? ?Allergies:   Patient has no known allergies.  ? ?ROS ?See HPI for history of present illness. ? ?Physical Exam ?Neurological:  ?   Mental Status: She is alert.  ?   Comments: Clear speech  ? ? ?    ?MDM: Pt with UTI sxs and fever. Sxs concerning for pyelonephritis. Advised that pt needs to be seen in person to have a ua and culture performed before starting abx. She agrees to be seen at [facility] urgent care.       ? ?There are no diagnoses linked to this encounter. ? ? ?Time:   ?Today, I have spent 7  minutes with the patient with telehealth technology discussing the above problems, reviewing the chart, previous notes, medications and orders.  ? ? ?Tests Ordered: ?No orders of the defined types were placed in this encounter. ? ? ?Medication Changes: ?No orders of the defined types were placed in this encounter. ? ? ? ?Disposition:  Follow up  ?Signed, ?Karrie Meres, PA-C  ?02/23/2022 5:39 PM    ? ? ?

## 2022-02-24 NOTE — ED Provider Notes (Signed)
?[facility] ? ? ? ?CSN: [id number] ?Arrival date & time: 02/23/22  1820 ? ? ?  ? ?History   ?Chief Complaint ?Chief Complaint  ?Patient presents with  ? Fever  ?  Fever for 2 days. 2 negative Covid tests. Pain with urinating throbbing back pain today when holding urine - Entered by patient  ? Flank Pain  ? ? ?HPI ?Laura Gordon is a 51 y.o. female.  ? ?51 year old female presents with fever of 102/103 for the past 2 days. Also having more urinary pressure and intermittent bilateral flank pain, especially when trying to urinate. Has noticed increased urinary frequency, urgency but decrease in urine today. Denies any nausea or vomiting. No hematuria or unusual vaginal discharge. Has history of UTI. Last UTI about 6 months ago and was treated with Cipro. Had a video visit with [facility] today and was advised to come to Urgent Care for UA and further evaluation. Has done 2 COVID home tests yesterday and today which were negative. Has taken OTC Motrin with some relief. Other chronic health issues include HTN, Trigeminal Neuralgia, hyperlipidemia, environmental allergies, gastric sleeve surgery and insomnia. Currently on Valsartan, Lexapro, Tegretol, Nortriptyline, Gabapentin, Ambien, aspirin, Biotin and Flonase daily and Xanax prn.  ? ?The history is provided by the patient.  ? ?Past Medical History:  ?Diagnosis Date  ? Depression   ? ? ?Patient Active Problem List  ? Diagnosis Date Noted  ? Trigeminal neuralgia 05/08/2018  ? Vagina, candidiasis 03/24/2018  ? Class 1 obesity without serious comorbidity with body mass index (BMI) of 33.0 to 33.9 in adult 09/06/2017  ? External hemorrhoid 07/27/2016  ? History of TIA (transient ischemic attack) 06/22/2015  ? High cholesterol 07/10/2013  ? Allergic rhinitis 06/06/2013  ? Frequent UTI 06/06/2013  ? Hx of diabetes mellitus 06/06/2013  ? HTN (hypertension) 06/06/2013  ? Generalized anxiety disorder 06/06/2013  ? Chronic insomnia 06/06/2013  ? ? ?Past  Surgical History:  ?Procedure Laterality Date  ? CESAREAN SECTION    ? CESAREAN SECTION N/A   ? ENDOMETRIAL ABLATION  10/25/2004  ? LAPAROSCOPIC GASTRIC SLEEVE RESECTION  08/2012  ? TUBAL LIGATION    ? ? ?OB History   ?No obstetric history on file. ?  ? ? ? ?Home Medications   ? ?Prior to Admission medications   ?Medication Sig Start Date End Date Taking? Authorizing Provider  ?carbamazepine (TEGRETOL XR) 100 MG 12 hr tablet Take 200 mg by mouth 3 (three) times daily. 07/25/18  Yes [provider]  ?cephALEXin (KEFLEX) 500 MG capsule Take 1 capsule (500 mg total) by mouth 2 (two) times daily for 7 days. 02/23/22 03/02/22 Yes Derold Dorsch, Ali LoweAnn Berry, NP  ?gabapentin (NEURONTIN) 600 MG tablet Take 600 mg by mouth 2 (two) times daily.   Yes [provider]  ?nortriptyline (PAMELOR) 10 MG capsule Take 30 mg by mouth at bedtime.   Yes [provider]  ?valsartan (DIOVAN) 40 MG tablet Take by mouth.   Yes [provider]  ?ALPRAZolam (XANAX) 0.25 MG tablet TAKE 1 TO 2 TABLETS BY MOUTH TWICE DAILY 12/04/19   Bedsole, Amy E, MD  ?aspirin EC 81 MG EC tablet Take 1 tablet (81 mg total) by mouth daily. 06/22/15   Katha HammingKonidena, Snehalatha, MD  ?Biotin 1000 MCG tablet Take 1,000 mcg by mouth 3 (three) times daily.    [provider]  ?escitalopram (LEXAPRO) 20 MG tablet Take 1 tablet by mouth once daily 12/25/19   Bedsole, Amy E,  MD  ?fluticasone (FLONASE) 50 MCG/ACT nasal spray USE TWO SPRAY(S) IN EACH NOSTRIL ONCE DAILY 09/06/17   Bedsole, Amy E, MD  ?folic acid (FOLVITE) 1 MG tablet Take 1 mg by mouth daily.    [provider]  ?Multiple Vitamin (MULTIVITAMIN) tablet Take 1 tablet by mouth daily.    [provider]  ?zolpidem (AMBIEN) 10 MG tablet TAKE 1 TABLET BY MOUTH AT BEDTIME AS NEEDED FOR SLEEP 01/24/20   Excell Seltzer, MD  ? ? ?Family History ?Family History  ?Problem Relation Age of Onset  ? Heart disease Mother 52  ? Diabetes Mother   ? Hypertension Mother   ? Hyperlipidemia  Mother   ? Kidney disease Father 43  ? Heart disease Father   ? Diabetes Daughter 11  ?     type 2   ? Diabetes Son 12  ?     type 1  ? Diabetes Maternal Grandmother   ? Heart disease Paternal Grandfather   ? Hyperlipidemia Paternal Grandfather   ? Hypertension Paternal Grandfather   ? ? ?Social History ?Social History  ? ?Tobacco Use  ? Smoking status: Former  ?  Packs/day: 1.00  ?  Years: 20.00  ?  Pack years: 20.00  ?  Types: Cigarettes  ? Smokeless tobacco: Never  ?Substance Use Topics  ? Alcohol use: No  ?  Alcohol/week: 0.0 standard drinks  ? Drug use: No  ? ? ? ?Allergies   ?Patient has no known allergies. ? ? ?Review of Systems ?Review of Systems  ?Constitutional:  Positive for appetite change, chills, fatigue and fever. Negative for diaphoresis.  ?HENT:  Negative for congestion, ear pain, facial swelling, mouth sores, postnasal drip, rhinorrhea, sinus pressure, sinus pain, sneezing, sore throat and trouble swallowing.   ?Eyes:  Negative for pain, discharge, redness and itching.  ?Respiratory:  Negative for cough, chest tightness and shortness of breath.   ?Gastrointestinal:  Negative for nausea and vomiting.  ?Genitourinary:  Positive for decreased urine volume, flank pain, frequency and urgency. Negative for difficulty urinating, dysuria, genital sores, hematuria and vaginal discharge.  ?Musculoskeletal:  Positive for back pain. Negative for arthralgias and myalgias.  ?Skin:  Negative for color change and rash.  ?Allergic/Immunologic: Positive for environmental allergies. Negative for food allergies and immunocompromised state.  ?Neurological:  Negative for dizziness, seizures, syncope, light-headedness and headaches.  ?Hematological:  Negative for adenopathy. Does not bruise/bleed easily.  ? ? ?Physical Exam ?Triage Vital Signs ?ED Triage Vitals  ?Enc Vitals Group  ?   BP 02/23/22 1918 (!) 139/93  ?   Pulse Rate 02/23/22 1918 97  ?   Resp 02/23/22 1918 16  ?   Temp 02/23/22 1918 99.8 ?F (37.7 ?C)  ?    Temp Source 02/23/22 1918 Oral  ?   SpO2 02/23/22 1918 97 %  ?   Weight --   ?   Height --   ?   Head Circumference --   ?   Peak Flow --   ?   Pain Score 02/23/22 1916 0  ?   Pain Loc --   ?   Pain Edu? --   ?   Excl. in GC? --   ? ?No data found. ? ?Updated Vital Signs ?BP (!) 139/93 (BP Location: Left Arm)   Pulse 97   Temp 99.8 ?F (37.7 ?C) (Oral)   Resp 16   SpO2 97%  ? ?Visual Acuity ?Right Eye Distance:   ?Left Eye Distance:   ?  Bilateral Distance:   ? ?Right Eye Near:   ?Left Eye Near:    ?Bilateral Near:    ? ?Physical Exam ?Vitals and nursing note reviewed.  ?Constitutional:   ?   General: She is awake. She is not in acute distress. ?   Appearance: She is well-developed and well-groomed.  ?   Comments: She is sitting on the exam table in no acute distress but appears tired and slightly uncomfortable.   ?HENT:  ?   Head: Normocephalic and atraumatic.  ?   Right Ear: Hearing normal.  ?   Left Ear: Hearing normal.  ?Eyes:  ?   Extraocular Movements: Extraocular movements intact.  ?   Conjunctiva/sclera: Conjunctivae normal.  ?Cardiovascular:  ?   Rate and Rhythm: Normal rate and regular rhythm.  ?   Heart sounds: Normal heart sounds. No murmur heard. ?Pulmonary:  ?   Effort: Pulmonary effort is normal. No respiratory distress.  ?   Breath sounds: Normal breath sounds and air entry. No decreased air movement. No decreased breath sounds, wheezing, rhonchi or rales.  ?Abdominal:  ?   General: Abdomen is flat.  ?   Palpations: Abdomen is soft. There is no hepatomegaly or splenomegaly.  ?   Tenderness: There is abdominal tenderness in the suprapubic area. There is no right CVA tenderness, left CVA tenderness, guarding or rebound.  ?   Comments: No current CVA tenderness or current flank pain.   ?Musculoskeletal:  ?   Cervical back: Normal range of motion.  ?Skin: ?   General: Skin is warm and dry.  ?   Findings: No rash.  ?Neurological:  ?   General: No focal deficit present.  ?   Mental Status: She is alert  and oriented to person, place, and time.  ?Psychiatric:     ?   Mood and Affect: Mood normal.     ?   Behavior: Behavior normal. Behavior is cooperative.     ?   Thought Content: Thought content normal.     ?   Judgm

## 2022-02-26 ENCOUNTER — Ambulatory Visit
Admission: EM | Admit: 2022-02-26 | Discharge: 2022-02-26 | Disposition: A | Discharge status: Home / Self Care | Payer: Managed Care, Other (non HMO) | Attending: Emergency Medicine | Admitting: Emergency Medicine

## 2022-02-26 DIAGNOSIS — R509 Fever, unspecified: Secondary | ICD-10-CM | POA: Insufficient documentation

## 2022-02-26 DIAGNOSIS — B9689 Other specified bacterial agents as the cause of diseases classified elsewhere: Secondary | ICD-10-CM | POA: Insufficient documentation

## 2022-02-26 DIAGNOSIS — N3000 Acute cystitis without hematuria: Secondary | ICD-10-CM | POA: Diagnosis not present

## 2022-02-26 DIAGNOSIS — N760 Acute vaginitis: Secondary | ICD-10-CM | POA: Insufficient documentation

## 2022-02-26 DIAGNOSIS — R9431 Abnormal electrocardiogram [ECG] [EKG]: Secondary | ICD-10-CM | POA: Insufficient documentation

## 2022-02-26 LAB — BASIC METABOLIC PANEL
Anion gap: 7 (ref 5–15)
BUN: 11 mg/dL (ref 6–20)
CO2: 28 mmol/L (ref 22–32)
Calcium: 8.5 mg/dL — ABNORMAL LOW (ref 8.9–10.3)
Chloride: 102 mmol/L (ref 98–111)
Creatinine, Ser: 0.58 mg/dL (ref 0.44–1.00)
GFR, Estimated: >60 mL/min (ref >60)
Glucose, Bld: 88 mg/dL (ref 70–99)
Potassium: 3.5 mmol/L (ref 3.5–5.1)
Sodium: 137 mmol/L (ref 135–145)

## 2022-02-26 LAB — WET PREP, GENITAL
Sperm: NONE SEEN
Trich, Wet Prep: NONE SEEN
WBC, Wet Prep HPF POC: <10 (ref <10)
Yeast Wet Prep HPF POC: NONE SEEN

## 2022-02-26 LAB — CBC WITH DIFFERENTIAL/PLATELET
Abs Immature Granulocytes: 0.02 10*3/uL (ref 0.00–0.07)
Basophils Absolute: 0.1 10*3/uL (ref 0.0–0.1)
Basophils Relative: 1 %
Eosinophils Absolute: 0.1 10*3/uL (ref 0.0–0.5)
Eosinophils Relative: 1 %
HCT: 31.9 % — ABNORMAL LOW (ref 36.0–46.0)
Hemoglobin: 10.8 g/dL — ABNORMAL LOW (ref 12.0–15.0)
Immature Granulocytes: 0 %
Lymphocytes Relative: 27 %
Lymphs Abs: 1.9 10*3/uL (ref 0.7–4.0)
MCH: 30.3 pg (ref 26.0–34.0)
MCHC: 33.9 g/dL (ref 30.0–36.0)
MCV: 89.6 fL (ref 80.0–100.0)
Monocytes Absolute: 1.2 10*3/uL — ABNORMAL HIGH (ref 0.1–1.0)
Monocytes Relative: 16 %
Neutro Abs: 3.9 10*3/uL (ref 1.7–7.7)
Neutrophils Relative %: 55 %
Platelets: 247 10*3/uL (ref 150–400)
RBC: 3.56 MIL/uL — ABNORMAL LOW (ref 3.87–5.11)
RDW: 12.3 % (ref 11.5–15.5)
WBC: 7.1 10*3/uL (ref 4.0–10.5)
nRBC: 0 % (ref 0.0–0.2)

## 2022-02-26 LAB — URINE CULTURE: Culture: >=100000 — AB

## 2022-02-26 LAB — URINALYSIS, ROUTINE W REFLEX MICROSCOPIC
Bilirubin Urine: NEGATIVE
Glucose, UA: NEGATIVE mg/dL
Ketones, ur: NEGATIVE mg/dL
Nitrite: NEGATIVE
Protein, ur: 30 mg/dL — AB
Specific Gravity, Urine: 1.02 (ref 1.005–1.030)
pH: 5.5 (ref 5.0–8.0)

## 2022-02-26 LAB — URINALYSIS, MICROSCOPIC (REFLEX)

## 2022-02-26 MED ORDER — METRONIDAZOLE 0.75 % VA GEL: 1.0000 | Freq: Every day | VAGINAL | 0 refills | Status: AC

## 2022-02-26 NOTE — Discharge Instructions (Addendum)
Your white cell count is normal and there are no signs of blood infection present. You are getting a little anemic, so make sure you take a vitamin with Iron. And follow up with your primary care doctor to be rechecked in one month.  ?Your urine is much better compared to the 2nd, so the medication is working ?Your chemistry shows normal kidney function.  ?The vaginal swab is showing a bacteria infection present.  ?Call your primary care doctor to have an urgent care follow up next week.  ?

## 2022-02-26 NOTE — ED Provider Notes (Signed)
?[facility] ? ? ? ?CSN: [id number] ?Arrival date & time: 02/26/22  0830 ? ? ?  ? ?History   ?Chief Complaint ?Chief Complaint  ?Patient presents with  ? Follow-up  ? ? ?HPI ?Laura Gordon is a 51 y.o. female who presents for Fu UTI and still having fevers of 101-102 since on Keflex. Denies respiratory symptoms.  ? ? ? ?Past Medical History:  ?Diagnosis Date  ? Depression   ? ? ?Patient Active Problem List  ? Diagnosis Date Noted  ? Pre-operative cardiovascular exam, new EKG abnormalities c/w ischemia 02/26/2022  ? Hypertensive response to exercise 07/17/2020  ? Abnormal brain MRI 04/08/2020  ? Hiatal hernia with GERD 03/06/2020  ? Status post bariatric surgery 03/06/2020  ? Trigeminal neuralgia of right side of face 05/08/2018  ? Vagina, candidiasis 03/24/2018  ? Class 1 obesity without serious comorbidity with body mass index (BMI) of 33.0 to 33.9 in adult 09/06/2017  ? External hemorrhoid 07/27/2016  ? History of TIA (transient ischemic attack) 06/22/2015  ? High cholesterol 07/10/2013  ? Allergic rhinitis 06/06/2013  ? Frequent UTI 06/06/2013  ? Hx of diabetes mellitus 06/06/2013  ? HTN (hypertension) 06/06/2013  ? Generalized anxiety disorder 06/06/2013  ? Chronic insomnia 06/06/2013  ? ? ?Past Surgical History:  ?Procedure Laterality Date  ? CESAREAN SECTION    ? CESAREAN SECTION N/A   ? ENDOMETRIAL ABLATION  10/25/2004  ? LAPAROSCOPIC GASTRIC SLEEVE RESECTION  08/2012  ? TUBAL LIGATION    ? ? ?OB History   ?No obstetric history on file. ?  ? ? ? ?Home Medications   ? ?Prior to Admission medications   ?Medication Sig Start Date End Date Taking? Authorizing Provider  ?ALPRAZolam (XANAX) 0.25 MG tablet TAKE 1 TO 2 TABLETS BY MOUTH TWICE DAILY 12/04/19  Yes Bedsole, Amy E, MD  ?aspirin EC 81 MG EC tablet Take 1 tablet (81 mg total) by mouth daily. 06/22/15  Yes Katha Hamming, MD  ?atorvastatin (LIPITOR) 40 MG tablet 40 mg daily. 12/25/19  Yes [provider]  ?Biotin 1000 MCG tablet Take  1,000 mcg by mouth 3 (three) times daily.   Yes [provider]  ?carbamazepine (TEGRETOL XR) 100 MG 12 hr tablet Take 200 mg by mouth 3 (three) times daily. 07/25/18  Yes [provider]  ?cephALEXin (KEFLEX) 500 MG capsule Take 1 capsule (500 mg total) by mouth 2 (two) times daily for 7 days. 02/23/22 03/02/22 Yes Amyot, Ali Lowe, NP  ?Diethylpropion HCl CR 75 MG TB24 75 mg daily. 01/21/20  Yes [provider]  ?EPITOL 200 MG tablet Take 400 mg by mouth 3 (three) times daily. 02/10/22  Yes [provider]  ?escitalopram (LEXAPRO) 20 MG tablet Take 1 tablet by mouth once daily 12/25/19  Yes Bedsole, Amy E, MD  ?fluticasone (FLONASE) 50 MCG/ACT nasal spray USE TWO SPRAY(S) IN EACH NOSTRIL ONCE DAILY 09/06/17  Yes Bedsole, Amy E, MD  ?folic acid (FOLVITE) 1 MG tablet Take 1 mg by mouth daily.   Yes [provider]  ?gabapentin (NEURONTIN) 400 MG capsule Take 400 mg by mouth 3 (three) times daily. 02/10/22  Yes [provider]  ?metroNIDAZOLE (METROGEL VAGINAL) 0.75 % vaginal gel Place 1 Applicatorful vaginally at bedtime for 5 days. 02/26/22 03/03/22 Yes Rodriguez-Southworth, Nettie Elm, PA-C  ?Multiple Vitamin (MULTIVITAMIN PO) Take 1 capsule by mouth daily.   Yes [provider]  ?Multiple Vitamins-Minerals (BARIATRIC MULTIVITAMINS/IRON) CAPS Bariatriv Advantage MV with Iron   Yes [provider]  ?  nortriptyline (PAMELOR) 10 MG capsule Take 30 mg by mouth at bedtime.   Yes [provider]  ?pantoprazole (PROTONIX) 40 MG tablet Take by mouth.   Yes [provider]  ?valsartan (DIOVAN) 40 MG tablet Take by mouth.   Yes [provider]  ?enoxaparin (LOVENOX) 150 MG/ML injection Inject into the skin.    [provider]  ?LORazepam (ATIVAN) 1 MG tablet Take one hour prior to MRI 01/28/20   [provider]  ?Pancrelipase, Lip-Prot-Amyl, (PANCREAZE) [phone] units CPEP Take 2 capsules with a meal and 1 capsule with a  snack 08/11/21   [provider]  ?traMADol (ULTRAM) 50 MG tablet Take 50 mg 1-2 times daily as needed 01/24/20   [provider]  ?zolpidem (AMBIEN) 10 MG tablet TAKE 1 TABLET BY MOUTH AT BEDTIME AS NEEDED FOR SLEEP 01/24/20   Excell SeltzerBedsole, Amy E, MD  ? ? ?Family History ?Family History  ?Problem Relation Age of Onset  ? Heart disease Mother [id number]  ? Diabetes Mother   ? Hypertension Mother   ? Hyperlipidemia Mother   ? Kidney disease Father [id number]  ? Heart disease Father   ? Diabetes Daughter 8519  ?     type 2   ? Diabetes Son 12  ?     type 1  ? Diabetes Maternal Grandmother   ? Heart disease Paternal Grandfather   ? Hyperlipidemia Paternal Grandfather   ? Hypertension Paternal Grandfather   ? ? ?Social History ?Social History  ? ?Tobacco Use  ? Smoking status: Former  ?  Packs/day: 1.00  ?  Years: 20.00  ?  Pack years: 20.00  ?  Types: Cigarettes  ? Smokeless tobacco: Never  ?Vaping Use  ? Vaping Use: Never used  ?Substance Use Topics  ? Alcohol use: No  ?  Alcohol/week: 0.0 standard drinks  ? Drug use: No  ? ? ? ?Allergies   ?Patient has no known allergies. ? ? ?Review of Systems ?Review of Systems  ?Constitutional:  Positive for fever.  ?Genitourinary:   ?     Bladder pressure  ?The rest is negative ? ?Physical Exam ?Triage Vital Signs ?ED Triage Vitals  ?Enc Vitals Group  ?   BP 02/26/22 0919 129/78  ?   Pulse Rate 02/26/22 0919 84  ?   Resp 02/26/22 0919 18  ?   Temp 02/26/22 0919 98.9 ?F (37.2 ?C)  ?   Temp Source 02/26/22 0919 Oral  ?   SpO2 02/26/22 0919 99 %  ?   Weight 02/26/22 0915 169 lb (76.7 kg)  ?   Height 02/26/22 0915 5\' 8"  (1.727 m)  ?   Head Circumference --   ?   Peak Flow --   ?   Pain Score 02/26/22 0913 0  ?   Pain Loc --   ?   Pain Edu? --   ?   Excl. in GC? --   ? ?No data found. ? ?Updated Vital Signs ?BP 129/78 (BP Location: Left Arm)   Pulse 84   Temp 98.9 ?F (37.2 ?C) (Oral)   Resp 18   Ht 5\' 8"  (1.727 m)   Wt 169 lb (76.7 kg)   LMP  (LMP Unknown)   SpO2 99%   BMI 25.70 kg/m?   ? ?Visual Acuity ?Right Eye Distance:   ?Left Eye Distance:   ?Bilateral Distance:   ? ?Right Eye Near:   ?Left Eye Near:    ?Bilateral Near:    ? ?  Physical Exam ?Vitals and nursing note reviewed.  ?Constitutional:   ?   General: She is not in acute distress. ?   Appearance: She is not toxic-appearing.  ?HENT:  ?   Right Ear: External ear normal.  ?   Left Ear: External ear normal.  ?Eyes:  ?   General: No scleral icterus. ?   Conjunctiva/sclera: Conjunctivae normal.  ?Cardiovascular:  ?   Rate and Rhythm: Normal rate and regular rhythm.  ?   Heart sounds: No murmur heard. ?Abdominal:  ?   General: Bowel sounds are normal.  ?   Palpations: Abdomen is soft.  ?   Tenderness: There is no right CVA tenderness, left CVA tenderness, guarding or rebound.  ?   Comments: Has mild tenderness on suprapubic region  ?Musculoskeletal:     ?   General: Normal range of motion.  ?   Cervical back: Normal range of motion and neck supple.  ?Skin: ?   General: Skin is warm and dry.  ?   Findings: No rash.  ?Neurological:  ?   Mental Status: She is alert and oriented to person, place, and time.  ?   Gait: Gait normal.  ?Psychiatric:     ?   Mood and Affect: Mood normal.     ?   Behavior: Behavior normal.     ?   Thought Content: Thought content normal.     ?   Judgment: Judgment normal.  ? ? ? ?UC Treatments / Results  ?Labs ?(all labs ordered are listed, but only abnormal results are displayed) ?Labs Reviewed  ?WET PREP, GENITAL - Abnormal; Notable for the following components:  ?    Result Value  ? Clue Cells Wet Prep HPF POC PRESENT (*)   ? All other components within normal limits  ?URINALYSIS, ROUTINE W REFLEX MICROSCOPIC - Abnormal; Notable for the following components:  ? APPearance HAZY (*)   ? Hgb urine dipstick MODERATE (*)   ? Protein, ur 30 (*)   ? Leukocytes,Ua TRACE (*)   ? All other components within normal limits  ?CBC WITH DIFFERENTIAL/PLATELET - Abnormal; Notable for the following components:  ? RBC 3.56 (*)   ?  Hemoglobin 10.8 (*)   ? HCT 31.9 (*)   ? Monocytes Absolute 1.2 (*)   ? All other components within normal limits  ?BASIC METABOLIC PANEL - Abnormal; Notable for the following components:  ? Calcium 8.5 (*)   ? All other

## 2022-02-26 NOTE — ED Triage Notes (Signed)
Patient is here for "recheck UTI". Still taking oral antibiotics, Still having "Fever" and "urinary pressure, no pain".  ?

## 2022-07-20 ENCOUNTER — Other Ambulatory Visit: Payer: Self-pay | Admitting: Family Medicine

## 2022-07-20 DIAGNOSIS — Z1231 Encounter for screening mammogram for malignant neoplasm of breast: Secondary | ICD-10-CM

## 2022-07-21 ENCOUNTER — Ambulatory Visit
Admission: RE | Admit: 2022-07-21 | Discharge: 2022-07-21 | Disposition: A | Discharge status: Home / Self Care | Payer: Managed Care, Other (non HMO) | Source: Ambulatory Visit | Attending: Family Medicine | Admitting: Family Medicine

## 2022-07-21 DIAGNOSIS — Z1231 Encounter for screening mammogram for malignant neoplasm of breast: Secondary | ICD-10-CM | POA: Insufficient documentation

## 2023-03-10 ENCOUNTER — Other Ambulatory Visit: Payer: Self-pay

## 2023-03-10 ENCOUNTER — Ambulatory Visit
Admission: RE | Admit: 2023-03-10 | Discharge: 2023-03-10 | Disposition: A | Discharge status: Home / Self Care | Payer: Managed Care, Other (non HMO) | Attending: Physician Assistant | Admitting: Physician Assistant

## 2023-03-10 ENCOUNTER — Ambulatory Visit
Admission: RE | Admit: 2023-03-10 | Discharge: 2023-03-10 | Disposition: A | Discharge status: Home / Self Care | Payer: Managed Care, Other (non HMO) | Source: Ambulatory Visit | Attending: Physician Assistant | Admitting: Physician Assistant

## 2023-03-10 DIAGNOSIS — S93401A Sprain of unspecified ligament of right ankle, initial encounter: Secondary | ICD-10-CM | POA: Diagnosis not present

## 2023-07-20 ENCOUNTER — Ambulatory Visit
Admission: RE | Admit: 2023-07-20 | Discharge: 2023-07-20 | Disposition: A | Discharge status: Home / Self Care | Payer: Managed Care, Other (non HMO) | Source: Ambulatory Visit | Attending: Emergency Medicine | Admitting: Emergency Medicine

## 2023-07-20 VITALS — BP 100/80 | HR 78 | Temp 98.4°F | Resp 16

## 2023-07-20 DIAGNOSIS — J069 Acute upper respiratory infection, unspecified: Secondary | ICD-10-CM

## 2023-07-20 LAB — SARS CORONAVIRUS 2 BY RT PCR: SARS Coronavirus 2 by RT PCR: NEGATIVE

## 2023-07-20 NOTE — ED Triage Notes (Signed)
Pt c/o fever and headache yesterday that has resolved today. She now has sinus pressure and sneezing.

## 2023-07-20 NOTE — Discharge Instructions (Signed)
Your covid test is negative. Most likely you have a viral illness: no antibiotic as indicated at this time, May treat with OTC meds of choice. Make sure to drink plenty of fluids to stay hydrated(gatorade, water, popsicles,jello,etc), avoid caffeine products. Follow up with PCP. Return as needed.

## 2023-07-20 NOTE — ED Provider Notes (Signed)
[facility]    CSN: [id number] Arrival date & time: 07/20/23  1115      History   Chief Complaint Chief Complaint  Patient presents with   Fever    Fever 100.3. Headache, sneezing - Entered by patient   Headache   Nasal Congestion   Sinus pressure     HPI Laura Gordon is a 52 y.o. female.   52 year old female, Ashyla Mullings, presents to urgent care for evaluation of fever, headache,sneezing that started yesterday. Now only with sinus pressure. Unknown illness exposure.  The history is provided by the patient. No language interpreter was used.    Past Medical History:  Diagnosis Date   Depression     Patient Active Problem List   Diagnosis Date Noted   Pre-operative cardiovascular exam, new EKG abnormalities c/w ischemia 02/26/2022   Hypertensive response to exercise 07/17/2020   Abnormal brain MRI 04/08/2020   Hiatal hernia with GERD 03/06/2020   Status post bariatric surgery 03/06/2020   Trigeminal neuralgia of right side of face 05/08/2018   Vagina, candidiasis 03/24/2018   Class 1 obesity without serious comorbidity with body mass index (BMI) of 33.0 to 33.9 in adult 09/06/2017   External hemorrhoid 07/27/2016   Viral URI 10/22/2015   History of TIA (transient ischemic attack) 06/22/2015   High cholesterol 07/10/2013   Allergic rhinitis 06/06/2013   Frequent UTI 06/06/2013   Hx of diabetes mellitus 06/06/2013   HTN (hypertension) 06/06/2013   Generalized anxiety disorder 06/06/2013   Chronic insomnia 06/06/2013    Past Surgical History:  Procedure Laterality Date   CESAREAN SECTION     CESAREAN SECTION N/A    ENDOMETRIAL ABLATION  10/25/2004   LAPAROSCOPIC GASTRIC SLEEVE RESECTION  08/2012   TUBAL LIGATION      OB History   No obstetric history on file.      Home Medications    Prior to Admission medications   Medication Sig Start Date End Date Taking? Authorizing Provider  ALPRAZolam (XANAX) 0.25 MG tablet TAKE 1 TO 2  TABLETS BY MOUTH TWICE DAILY 12/04/19   Bedsole, Amy E, MD  aspirin EC 81 MG EC tablet Take 1 tablet (81 mg total) by mouth daily. 06/22/15   Katha Hamming, MD  atorvastatin (LIPITOR) 40 MG tablet 40 mg daily. 12/25/19   [provider]  Biotin 1000 MCG tablet Take 1,000 mcg by mouth 3 (three) times daily.    [provider]  carbamazepine (TEGRETOL XR) 100 MG 12 hr tablet Take 200 mg by mouth 3 (three) times daily. 07/25/18   [provider]  Diethylpropion HCl CR 75 MG TB24 75 mg daily. 01/21/20   [provider]  enoxaparin (LOVENOX) 150 MG/ML injection Inject into the skin.    [provider]  EPITOL 200 MG tablet Take 400 mg by mouth 3 (three) times daily. 02/10/22   [provider]  escitalopram (LEXAPRO) 20 MG tablet Take 1 tablet by mouth once daily 12/25/19   Bedsole, Amy E, MD  fluticasone (FLONASE) 50 MCG/ACT nasal spray USE TWO SPRAY(S) IN EACH NOSTRIL ONCE DAILY 09/06/17   Bedsole, Amy E, MD  folic acid (FOLVITE) 1 MG tablet Take 1 mg by mouth daily.    [provider]  gabapentin (NEURONTIN) 400 MG capsule Take 400 mg by mouth 3 (three) times daily. 02/10/22   [provider]  LORazepam (ATIVAN) 1 MG tablet Take one hour prior to MRI 01/28/20   [provider]  Multiple Vitamin (MULTIVITAMIN PO) Take 1 capsule by mouth daily.    [provider]  Multiple Vitamins-Minerals (BARIATRIC MULTIVITAMINS/IRON) CAPS Bariatriv Advantage MV with Iron    [provider]  nortriptyline (PAMELOR) 10 MG capsule Take 30 mg by mouth at bedtime.    [provider]  Pancrelipase, Lip-Prot-Amyl, (PANCREAZE) [phone] units CPEP Take 2 capsules with a meal and 1 capsule with a snack 08/11/21   [provider]  pantoprazole (PROTONIX) 40 MG tablet Take by mouth.    [provider]  traMADol (ULTRAM) 50 MG tablet Take 50 mg 1-2 times daily as needed 01/24/20   [provider]   valsartan (DIOVAN) 40 MG tablet Take by mouth.    [provider]  zolpidem (AMBIEN) 10 MG tablet TAKE 1 TABLET BY MOUTH AT BEDTIME AS NEEDED FOR SLEEP 01/24/20   Excell Seltzer, MD    Family History Family History  Problem Relation Age of Onset   Heart disease Mother 40   Diabetes Mother    Hypertension Mother    Hyperlipidemia Mother    Kidney disease Father 81   Heart disease Father    Diabetes Daughter 59       type 2    Diabetes Son 29       type 1   Diabetes Maternal Grandmother    Heart disease Paternal Grandfather    Hyperlipidemia Paternal Grandfather    Hypertension Paternal Grandfather     Social History Social History   Tobacco Use   Smoking status: Former    Current packs/day: 1.00    Average packs/day: 1 pack/day for 20.0 years (20.0 ttl pk-yrs)    Types: Cigarettes   Smokeless tobacco: Never  Vaping Use   Vaping status: Never Used  Substance Use Topics   Alcohol use: No    Alcohol/week: 0.0 standard drinks of alcohol   Drug use: No     Allergies   Patient has no known allergies.   Review of Systems Review of Systems  Constitutional:  Positive for fever.  HENT:  Positive for sinus pressure and sneezing.   Neurological:  Positive for headaches.  All other systems reviewed and are negative.    Physical Exam Triage Vital Signs ED Triage Vitals  Encounter Vitals Group     BP      Systolic BP Percentile      Diastolic BP Percentile      Pulse      Resp      Temp      Temp src      SpO2      Weight      Height      Head Circumference      Peak Flow      Pain Score      Pain Loc      Pain Education      Exclude from Growth Chart    No data found.  Updated Vital Signs BP 100/80 (BP Location: Left Arm)   Pulse 78   Temp 98.4 F (36.9 C) (Oral)   Resp 16   SpO2 100%   Visual Acuity Right Eye Distance:   Left Eye Distance:   Bilateral Distance:    Right Eye Near:   Left Eye Near:    Bilateral Near:     Physical  Exam Vitals and nursing note reviewed.  Constitutional:      General: She is not in acute distress.    Appearance: She is  well-developed.  HENT:     Head: Normocephalic and atraumatic.     Right Ear: Tympanic membrane is retracted.     Left Ear: Tympanic membrane is retracted.     Nose: Mucosal edema and congestion present.     Right Sinus: No maxillary sinus tenderness or frontal sinus tenderness.     Left Sinus: No maxillary sinus tenderness or frontal sinus tenderness.     Mouth/Throat:     Lips: Pink.     Mouth: Mucous membranes are moist.     Pharynx: Oropharynx is clear.  Eyes:     Conjunctiva/sclera: Conjunctivae normal.  Cardiovascular:     Rate and Rhythm: Normal rate and regular rhythm.     Pulses: Normal pulses.     Heart sounds: Normal heart sounds. No murmur heard. Pulmonary:     Effort: Pulmonary effort is normal. No respiratory distress.     Breath sounds: Normal breath sounds and air entry.  Abdominal:     Palpations: Abdomen is soft.     Tenderness: There is no abdominal tenderness.  Musculoskeletal:        General: No swelling.     Cervical back: Neck supple.  Skin:    General: Skin is warm and dry.     Capillary Refill: Capillary refill takes less than 2 seconds.  Neurological:     General: No focal deficit present.     Mental Status: She is alert and oriented to person, place, and time.     GCS: GCS eye subscore is 4. GCS verbal subscore is 5. GCS motor subscore is 6.  Psychiatric:        Attention and Perception: Attention normal.        Mood and Affect: Mood normal.        Speech: Speech normal.        Behavior: Behavior normal.      UC Treatments / Results  Labs (all labs ordered are listed, but only abnormal results are displayed) Labs Reviewed  SARS CORONAVIRUS 2 BY RT PCR    EKG   Radiology No results found.  Procedures Procedures (including critical care time)  Medications Ordered in UC Medications - No data to  display  Initial Impression / Assessment and Plan / UC Course  I have reviewed the triage vital signs and the nursing notes.  Pertinent labs & imaging results that were available during my care of the patient were reviewed by me and considered in my medical decision making (see chart for details).     Ddx: Viral illness,allergies Final Clinical Impressions(s) / UC Diagnoses   Final diagnoses:  Viral URI     Discharge Instructions      Your covid test is negative. Most likely you have a viral illness: no antibiotic as indicated at this time, May treat with OTC meds of choice. Make sure to drink plenty of fluids to stay hydrated(gatorade, water, popsicles,jello,etc), avoid caffeine products. Follow up with PCP. Return as needed.     ED Prescriptions   None    PDMP not reviewed this encounter.   Clancy Gourd, NP 07/20/23 1928

## 2023-08-05 ENCOUNTER — Other Ambulatory Visit: Payer: Self-pay | Admitting: Physician Assistant

## 2023-08-05 DIAGNOSIS — Z1231 Encounter for screening mammogram for malignant neoplasm of breast: Secondary | ICD-10-CM

## 2023-11-24 ENCOUNTER — Other Ambulatory Visit: Payer: Self-pay | Admitting: Family Medicine

## 2023-11-24 DIAGNOSIS — Z1231 Encounter for screening mammogram for malignant neoplasm of breast: Secondary | ICD-10-CM

## 2023-12-09 ENCOUNTER — Ambulatory Visit
Admission: RE | Admit: 2023-12-09 | Discharge: 2023-12-09 | Disposition: A | Discharge status: Home / Self Care | Payer: Managed Care, Other (non HMO) | Source: Ambulatory Visit | Attending: Family Medicine | Admitting: Family Medicine

## 2023-12-09 DIAGNOSIS — Z1231 Encounter for screening mammogram for malignant neoplasm of breast: Secondary | ICD-10-CM | POA: Diagnosis present

## 2024-06-27 ENCOUNTER — Ambulatory Visit: Admitting: Urology

## 2024-06-27 VITALS — BP 99/67 | HR 85 | Wt 154.0 lb

## 2024-06-27 DIAGNOSIS — R3129 Other microscopic hematuria: Secondary | ICD-10-CM

## 2024-06-27 DIAGNOSIS — N390 Urinary tract infection, site not specified: Secondary | ICD-10-CM | POA: Diagnosis not present

## 2024-06-27 LAB — URINALYSIS, COMPLETE
Bilirubin, UA: NEGATIVE
Glucose, UA: NEGATIVE
Ketones, UA: NEGATIVE
Nitrite, UA: POSITIVE — AB
Protein,UA: NEGATIVE
Specific Gravity, UA: 1.01 (ref 1.005–1.030)
Urobilinogen, Ur: 0.2 mg/dL (ref 0.2–1.0)
pH, UA: 6 (ref 5.0–7.5)

## 2024-06-27 LAB — MICROSCOPIC EXAMINATION

## 2024-06-27 MED ORDER — SULFAMETHOXAZOLE-TRIMETHOPRIM 800-160 MG PO TABS: 1.0000 | ORAL_TABLET | Freq: Two times a day (BID) | ORAL | 0 refills | Status: AC

## 2024-06-27 MED ORDER — ESTRADIOL 0.1 MG/GM VA CREA: TOPICAL_CREAM | VAGINAL | 12 refills | Status: AC

## 2024-06-27 NOTE — Addendum Note (Signed)
 Addended by: Lizzett Nobile A on: 06/27/2024 02:20 PM   Modules accepted: Orders

## 2024-06-27 NOTE — Progress Notes (Signed)
 06/27/24 2:09 PM   Laura Gordon 1970/10/29 [id number]  CC: Recurrent UTI  HPI: 53 year old female referred for recurrent UTIs.  She reports a lifelong history of recurrent infections, but these have increased over the last 6 months.  She thinks she has had 4 infections over the last 6 months.  Primary symptoms are urinary urgency, frequency, nocturia.  Symptoms typically improved with antibiotics.  She denies any gross hematuria or flank pain.  She has had multiple urine samples with microscopic hematuria.    Urinalysis today grossly concerning for infection with 11-30 WBC, 3-10 RBC, many bacteria, 2+ leukocytes, nitrite positive.  Will send for culture.  She does report symptoms today of urgency and frequency.   PMH: Past Medical History:  Diagnosis Date   Depression     Surgical History: Past Surgical History:  Procedure Laterality Date   CESAREAN SECTION     CESAREAN SECTION N/A    ENDOMETRIAL ABLATION  10/25/2004   LAPAROSCOPIC GASTRIC SLEEVE RESECTION  08/2012   TUBAL LIGATION      Family History: Family History  Problem Relation Age of Onset   Heart disease Mother 43   Diabetes Mother    Hypertension Mother    Hyperlipidemia Mother    Kidney disease Father 85   Heart disease Father    Diabetes Daughter 19       type 2    Diabetes Maternal Grandmother    Heart disease Paternal Grandfather    Hyperlipidemia Paternal Grandfather    Hypertension Paternal Grandfather    Diabetes Son 12       type 1   Breast cancer Neg Hx     Social History:  reports that she has quit smoking. Her smoking use included cigarettes. She has a 20 pack-year smoking history. She has never used smokeless tobacco. She reports that she does not drink alcohol and does not use drugs.  Physical Exam: BP 99/67 (BP Location: Left Arm, Patient Position: Sitting, Cuff Size: Normal)   Pulse 85   Wt 154 lb (69.9 kg)   SpO2 97%   BMI 23.42 kg/m    Constitutional:  Alert and oriented, No  acute distress. Cardiovascular: No clubbing, cyanosis, or edema. Respiratory: Normal respiratory effort, no increased work of breathing. GI: Abdomen is soft, nontender, nondistended, no abdominal masses   Laboratory Data: Reviewed, see HPI, see media tab for culture data  Pertinent Imaging: None to review  Assessment & Plan:   53 year old female with at least 4 UTIs in the last 6 months, multiple urine samples with microscopic hematuria.  We discussed the evaluation and treatment of patients with recurrent UTIs at length.  We specifically discussed the differences between asymptomatic bacteriuria and true urinary tract infection.  We discussed the AUA definition of recurrent UTI of at least 2 culture proven symptomatic acute cystitis episodes in a 64-month period, or 3 within a 1 year period.  We discussed the importance of culture directed antibiotic treatment, and antibiotic stewardship.  First-line therapy includes nitrofurantoin(5 days), Bactrim (3 days), or fosfomycin(3 g single dose).  Possible etiologies of recurrent infection include periurethral tissue atrophy in postmenopausal woman, constipation, sexual activity, incomplete emptying, anatomic abnormalities, and even genetic predisposition.  Finally, we discussed the role of perineal hygiene, timed voiding, adequate hydration, topical vaginal estrogen, cranberry prophylaxis, and low-dose antibiotic prophylaxis.  With her multiple urine samples with microscopic blood and recurrent infections despite appropriate antibiotics, I think reasonable to obtain CT scan as well to rule out nephrolithiasis, mass,  or obstruction  Bactrim  DS twice daily x 7 days for acute UTI today, follow-up culture and atypicals Start topical estrogen cream, cranberry tablet prophylaxis CT for further evaluation of recurrent infections with multiple microscopic analysis with blood, call with results Consider cystoscopy in the future RTC 3 months symptom  check  Redell Burnet, MD 06/27/2024  [facility] [address], [address] [place], [place] [address] [phone]

## 2024-06-29 ENCOUNTER — Other Ambulatory Visit (HOSPITAL_COMMUNITY): Payer: Self-pay

## 2024-07-03 LAB — CULTURE, URINE COMPREHENSIVE

## 2024-07-04 LAB — SPECIMEN STATUS REPORT

## 2024-07-05 ENCOUNTER — Ambulatory Visit: Payer: Self-pay

## 2024-07-05 NOTE — Telephone Encounter (Signed)
-----   Message from Redell JAYSON Burnet sent at 07/05/2024  8:28 AM EDT -----  ----- Message ----- From: Interface, [facility] Lab Results In Sent: 06/27/2024   4:36 PM EDT To: Redell JAYSON Burnet, MD

## 2024-07-05 NOTE — Telephone Encounter (Signed)
 Form for [facility] completed and faxed.

## 2024-07-09 ENCOUNTER — Ambulatory Visit

## 2024-07-10 LAB — MYCOPLASMA / UREAPLASMA CULTURE
Mycoplasma hominis Culture: NEGATIVE
Ureaplasma urealyticum: NEGATIVE

## 2024-09-24 NOTE — Progress Notes (Deleted)
 09/26/2024 9:38 PM   [name] [birth year] [id number]  Referring provider: Avelina Greig BRAVO, MD [address] [place],  [place] [address]  Urological history: 1. rUTI's - June 27, 2024 - atypical's negative - June 27, 2024 - E.coli - vaginal estrogen cream and cranberry tablets  2. High risk hematuria - former smoker - CTU pending  No chief complaint on file.  HPI: Laura Gordon is a 53 y.o. woman who presents today for three month follow up.  Previous records reviewed.  They are having (1 to 7) or (8 or more) daytime voids,  they are having nocturia (1-2) or (3 or more) and urgency is (none, mild, strong, severe).   They are having (stress, urge or mixed incontinence.)    they are having urinary leakage (1-2 times weekly, 3 or more times weekly, 1-2 times daily and 3 or more times daily) They are using absorbent products for leakage (no, sometimes, always )   the type of products they use are (panty liners, absorbant pads, depends) *** daily.  They are not limiting fluids.  They are not engaging in toilet mapping  ***  UA ***  PMH: Past Medical History:  Diagnosis Date   Depression     Surgical History: Past Surgical History:  Procedure Laterality Date   CESAREAN SECTION     CESAREAN SECTION N/A    ENDOMETRIAL ABLATION  10/25/2004   LAPAROSCOPIC GASTRIC SLEEVE RESECTION  08/2012   TUBAL LIGATION      Home Medications:  Allergies as of 09/26/2024   No Known Allergies      Medication List        Accurate as of September 24, 2024  9:38 PM. If you have any questions, ask your nurse or doctor.          ALPRAZolam  0.25 MG tablet Commonly known as: XANAX  TAKE 1 TO 2 TABLETS BY MOUTH TWICE DAILY   aspirin  EC 81 MG tablet Take 1 tablet (81 mg total) by mouth daily.   atorvastatin  40 MG tablet Commonly known as: LIPITOR 40 mg daily.   Bariatric Multivitamins/Iron Caps Bariatriv Advantage MV with Iron   Biotin 1000 MCG  tablet Take 1,000 mcg by mouth 3 (three) times daily.   carbamazepine 100 MG 12 hr tablet Commonly known as: TEGRETOL XR Take 200 mg by mouth 3 (three) times daily.   Epitol 200 MG tablet Generic drug: carbamazepine Take 400 mg by mouth 3 (three) times daily.   Diethylpropion HCl CR 75 MG Tb24 75 mg daily.   enoxaparin  150 MG/ML injection Commonly known as: LOVENOX  Inject into the skin.   escitalopram  20 MG tablet Commonly known as: LEXAPRO  Take 1 tablet by mouth once daily   estradiol  0.1 MG/GM vaginal cream Commonly known as: ESTRACE  Estrogen Cream Instructions Discard applicator Apply pea sized amount to tip of finger to urethra before bed every night for 1 week the use Monday, Wednesday and Friday. Wash hands well after application.   fluticasone  50 MCG/ACT nasal spray Commonly known as: FLONASE  USE TWO SPRAY(S) IN EACH NOSTRIL ONCE DAILY   folic acid 1 MG tablet Commonly known as: FOLVITE Take 1 mg by mouth daily.   gabapentin 400 MG capsule Commonly known as: NEURONTIN Take 400 mg by mouth 3 (three) times daily.   LORazepam 1 MG tablet Commonly known as: ATIVAN Take one hour prior to MRI   MULTIVITAMIN PO Take 1 capsule by mouth daily.   nortriptyline 10 MG  capsule Commonly known as: PAMELOR Take 30 mg by mouth at bedtime.   Pancreaze 37000-97300 units Cpep Generic drug: Pancrelipase (Lip-Prot-Amyl) Take 2 capsules with a meal and 1 capsule with a snack   pantoprazole 40 MG tablet Commonly known as: PROTONIX Take by mouth.   sulfamethoxazole -trimethoprim  800-160 MG tablet Commonly known as: BACTRIM  DS Take 1 tablet by mouth every 12 (twelve) hours.   traMADol 50 MG tablet Commonly known as: ULTRAM Take 50 mg 1-2 times daily as needed   valsartan  40 MG tablet Commonly known as: DIOVAN  Take by mouth.   zolpidem  10 MG tablet Commonly known as: AMBIEN  TAKE 1 TABLET BY MOUTH AT BEDTIME AS NEEDED FOR SLEEP        Allergies: No Known  Allergies  Family History: Family History  Problem Relation Age of Onset   Heart disease Mother 28   Diabetes Mother    Hypertension Mother    Hyperlipidemia Mother    Kidney disease Father 68   Heart disease Father    Diabetes Daughter 21       type 2    Diabetes Maternal Grandmother    Heart disease Paternal Grandfather    Hyperlipidemia Paternal Grandfather    Hypertension Paternal Grandfather    Diabetes Son 12       type 1   Breast cancer Neg Hx     Social History:  reports that she has quit smoking. Her smoking use included cigarettes. She has a 20 pack-year smoking history. She has never used smokeless tobacco. She reports that she does not drink alcohol and does not use drugs.  ROS: Pertinent ROS in HPI  Physical Exam: There were no vitals taken for this visit.  Constitutional:  Well nourished. Alert and oriented, No acute distress. HEENT: Christmas AT, moist mucus membranes.  Trachea midline, no masses. Cardiovascular: No clubbing, cyanosis, or edema. Respiratory: Normal respiratory effort, no increased work of breathing. GU: No CVA tenderness.  No bladder fullness or masses.  Recession of labia minora, dry, pale vulvar vaginal mucosa and loss of mucosal ridges and folds.  Normal urethral meatus, no lesions, no prolapse, no discharge.   No urethral masses, tenderness and/or tenderness. No bladder fullness, tenderness or masses. *** vagina mucosa, *** estrogen effect, no discharge, no lesions, *** pelvic support, *** cystocele and *** rectocele noted.  No cervical motion tenderness.  Uterus is freely mobile and non-fixed.  No adnexal/parametria masses or tenderness noted.  Anus and perineum are without rashes or lesions.   ***  Neurologic: Grossly intact, no focal deficits, moving all 4 extremities. Psychiatric: Normal mood and affect.    Laboratory Data: See Epic and HPI   I have reviewed the labs.   Pertinent Imaging: ***  Assessment & Plan:  ***  1. rUTI's  -  criteria for recurrent UTI has been met with 2 or more infections in 6 months or 3 or greater infections in one year ***            - vaginal estrogen cream is first-line therapy for postmenopausal women ***  - patient is instructed to increase their water intake until the urine is pale yellow or clear (10 to 12 cups daily) ***  - taking probiotics that include  Lactobacillus crispatus in premenopausal women and oral capsules with Lactobacillus rhamnosus GR-1 and Lactobacillus reuteri RC-14 in postmenopausal women ***  - Hiprex 1 gram twice daily if creatinine clearance is > 30 ***  - could consider D-mannose or cranberry products,  but evidence is weak with contradictory findings ***  - address constipation issues ***  - if using tampons, she should remove them prior to urinating and change them often ***  2. Microscopic hematuria -We reviewed that hematuria (blood in the urine) may result from a variety of causes, including nephrolithiasis (kidney stones), benign prostatic hyperplasia (BPH)***, urinary tract infections (UTIs), trauma or structural abnormalities of the urinary tract, and malignancy. It was noted that in some cases, no definitive etiology is identified despite thorough evaluation. -We discussed that a CT urogram involves intravenous administration of contrast material to enhance imaging of the urinary tract. Although rare, contrast reactions may occur, with severe reactions estimated at approximately 1 in 100,000. The patient denies any known allergies to iodinated contrast, iodine, or seafood, and is not currently taking metformin. *** -Following imaging, cystoscopy will be performed. This procedure involves passing a small camera through the urethra into the bladder after administration of topical lidocaine for local anesthesia. Post-procedural symptoms may include mild hematuria and dysuria, typically resolving within 24 to 48 hours. -The patient was given the opportunity to ask  questions, which were addressed in detail. After reviewing the risks, benefits, and rationale for evaluation, the patient expressed understanding and agreed to proceed. Orders Placed: CT urogram, RUS *** Cystoscopy *** Urinalysis (UA) *** Urine culture *** Basic metabolic panel (BMP) or recent serum creatinine *** Serum pregnancy test (if applicable) *** Plan: The patient will return for follow-up after completion of the above studies to review findings and determine next steps.                                       No follow-ups on file.  These notes generated with voice recognition software. I apologize for typographical errors.  CLOTILDA HELON RIGGERS  [facility] [address]  [address] [place], [place] [address] [phone]

## 2024-09-26 ENCOUNTER — Ambulatory Visit

## 2024-09-26 DIAGNOSIS — R3129 Other microscopic hematuria: Secondary | ICD-10-CM

## 2024-09-26 DIAGNOSIS — N390 Urinary tract infection, site not specified: Secondary | ICD-10-CM
